# Patient Record
Sex: Female | Born: 2000 | Hispanic: Yes | Marital: Single | Smoking: Never smoker
Health system: Southern US, Community
[De-identification: ages and names within clinical notes are randomized; demographics above are authoritative.]

## PROBLEM LIST (undated history)

## (undated) ENCOUNTER — Inpatient Hospital Stay (HOSPITAL_COMMUNITY): Payer: Self-pay

## (undated) DIAGNOSIS — O99810 Abnormal glucose complicating pregnancy: Secondary | ICD-10-CM

## (undated) DIAGNOSIS — E119 Type 2 diabetes mellitus without complications: Secondary | ICD-10-CM

## (undated) DIAGNOSIS — O24419 Gestational diabetes mellitus in pregnancy, unspecified control: Secondary | ICD-10-CM

## (undated) DIAGNOSIS — B009 Herpesviral infection, unspecified: Secondary | ICD-10-CM

## (undated) HISTORY — DX: Abnormal glucose complicating pregnancy: O99.810

---
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
## 2019-08-05 ENCOUNTER — Other Ambulatory Visit: Payer: Self-pay

## 2019-08-05 ENCOUNTER — Encounter (HOSPITAL_COMMUNITY): Payer: Self-pay | Admitting: Emergency Medicine

## 2019-08-05 ENCOUNTER — Ambulatory Visit (HOSPITAL_COMMUNITY)
Admission: EM | Admit: 2019-08-05 | Discharge: 2019-08-05 | Disposition: A | Discharge status: Home / Self Care | Payer: Medicaid Other | Attending: Family Medicine | Admitting: Family Medicine

## 2019-08-05 DIAGNOSIS — H6982 Other specified disorders of Eustachian tube, left ear: Secondary | ICD-10-CM

## 2019-08-05 DIAGNOSIS — H6992 Unspecified Eustachian tube disorder, left ear: Secondary | ICD-10-CM

## 2019-08-05 MED ORDER — IBUPROFEN 600 MG PO TABS: 600.0000 mg | ORAL_TABLET | Freq: Three times a day (TID) | ORAL | 0 refills | Status: DC | PRN

## 2019-08-05 MED ORDER — FLUTICASONE PROPIONATE 50 MCG/ACT NA SUSP: 1.0000 | Freq: Every day | NASAL | 2 refills | Status: DC

## 2019-08-05 NOTE — ED Provider Notes (Signed)
[facility]    CSN: [id number] Arrival date & time: 08/05/19  1205      History   Chief Complaint Chief Complaint  Patient presents with  . Otalgia    HPI Danielle Lowery is a 19 y.o. female.   Patient is a 19 year old female presents today with left ear pain.  This started yesterday.  She is also has been associated nasal congestion and time to time.  Denies any current cough, runny nose, fever, sore throat.      History reviewed. No pertinent past medical history.  There are no problems to display for this patient.   History reviewed. No pertinent surgical history.  OB History   No obstetric history on file.      Home Medications    Prior to Admission medications   Medication Sig Start Date End Date Taking? Authorizing Provider  fluticasone (FLONASE) 50 MCG/ACT nasal spray Place 1 spray into both nostrils daily. 08/05/19   Dahlia Byes A, NP  ibuprofen (ADVIL) 600 MG tablet Take 1 tablet (600 mg total) by mouth every 8 (eight) hours as needed for moderate pain. 08/05/19   Dahlia Byes A, NP  famotidine (PEPCID) 20 MG tablet Take 1 tablet (20 mg total) by mouth 2 (two) times daily. 07/20/17 08/05/19  Liberty Handy, PA-C    Family History Family History  Problem Relation Age of Onset  . Diabetes Mother   . Diabetes Father     Social History Social History   Tobacco Use  . Smoking status: Never Smoker  . Smokeless tobacco: Never Used  Substance Use Topics  . Alcohol use: No  . Drug use: No     Allergies   Patient has no known allergies.   Review of Systems Review of Systems   Physical Exam Triage Vital Signs ED Triage Vitals  Enc Vitals Group     BP 08/05/19 1341 113/65     Pulse Rate 08/05/19 1341 70     Resp 08/05/19 1341 16     Temp 08/05/19 1341 98.9 F (37.2 C)     Temp Source 08/05/19 1341 Oral     SpO2 08/05/19 1341 100 %     Weight --      Height --      Head Circumference --      Peak Flow --      Pain Score  08/05/19 1346 4     Pain Loc --      Pain Edu? --      Excl. in GC? --    No data found.  Updated Vital Signs BP 113/65   Pulse 70   Temp 98.9 F (37.2 C) (Oral)   Resp 16   LMP 07/27/2019   SpO2 100%   Visual Acuity Right Eye Distance:   Left Eye Distance:   Bilateral Distance:    Right Eye Near:   Left Eye Near:    Bilateral Near:     Physical Exam Vitals and nursing note reviewed.  Constitutional:      General: She is not in acute distress.    Appearance: Normal appearance. She is not ill-appearing, toxic-appearing or diaphoretic.  HENT:     Head: Normocephalic.     Right Ear: Tympanic membrane and ear canal normal.     Left Ear: Tympanic membrane and ear canal normal.     Nose: Nose normal.  Eyes:     Conjunctiva/sclera: Conjunctivae normal.  Pulmonary:     Effort: Pulmonary  effort is normal.  Musculoskeletal:        General: Normal range of motion.     Cervical back: Normal range of motion.  Skin:    General: Skin is warm and dry.     Findings: No rash.  Neurological:     Mental Status: She is alert.  Psychiatric:        Mood and Affect: Mood normal.      UC Treatments / Results  Labs (all labs ordered are listed, but only abnormal results are displayed) Labs Reviewed - No data to display  EKG   Radiology No results found.  Procedures Procedures (including critical care time)  Medications Ordered in UC Medications - No data to display  Initial Impression / Assessment and Plan / UC Course  I have reviewed the triage vital signs and the nursing notes.  Pertinent labs & imaging results that were available during my care of the patient were reviewed by me and considered in my medical decision making (see chart for details).     Eustachian tube dysfunction No signs of ear infection on exam We will prescribe Flonase Ibuprofen as needed Follow up as needed for continued or worsening symptoms  Final Clinical Impressions(s) / UC Diagnoses    Final diagnoses:  Eustachian tube dysfunction, left     Discharge Instructions     The ear is not infected Use the Flonase daily and ibuprofen as needed for pain.  Follow up as needed for continued or worsening symptoms     ED Prescriptions    Medication Sig Dispense Auth. Provider   fluticasone (FLONASE) 50 MCG/ACT nasal spray Place 1 spray into both nostrils daily. 16 g Arrionna Serena A, NP   ibuprofen (ADVIL) 600 MG tablet Take 1 tablet (600 mg total) by mouth every 8 (eight) hours as needed for moderate pain. 30 tablet Dahlia Byes A, NP     PDMP not reviewed this encounter.   Dahlia Byes A, NP 08/05/19 1500

## 2019-08-05 NOTE — ED Triage Notes (Signed)
Left ear pain started yesterday.  Denies a cough or runny nose

## 2019-08-05 NOTE — Discharge Instructions (Addendum)
The ear is not infected Use the Flonase daily and ibuprofen as needed for pain.  Follow up as needed for continued or worsening symptoms

## 2021-05-15 ENCOUNTER — Ambulatory Visit (INDEPENDENT_AMBULATORY_CARE_PROVIDER_SITE_OTHER): Payer: Medicaid Other | Admitting: Obstetrics & Gynecology

## 2021-05-15 ENCOUNTER — Encounter: Payer: Self-pay | Admitting: General Practice

## 2021-05-15 VITALS — BP 118/66 | HR 83 | Ht 59.0 in | Wt 141.0 lb

## 2021-05-15 DIAGNOSIS — Z3201 Encounter for pregnancy test, result positive: Secondary | ICD-10-CM

## 2021-05-15 LAB — POCT PREGNANCY, URINE: Preg Test, Ur: POSITIVE — AB

## 2021-05-15 NOTE — Progress Notes (Signed)
Patient presents to office today for UPT. UPT +. Patient reports first positive home test beginning of March. She reports history of irregular periods and can go months without one. LMP 01/23/21 EDD 10/30/21 16 weeks today. Patient would like to go to [place] office for prenatal care. Recommended she begin taking PNV. ? ?Chase Caller RN BSN ?05/15/21 ? ?

## 2021-05-15 NOTE — Patient Instructions (Signed)
Center for [facility] [place]: [phone] ?

## 2021-05-15 NOTE — Progress Notes (Signed)
Patient ID: Danielle Lowery, female   DOB: Jan 03, 2001, 21 y.o.   MRN: [id number] ?Patient was assessed and managed by nursing staff during this encounter. I have reviewed the chart and agree with the documentation and plan. I have also made any necessary editorial changes. ? ?Scheryl Darter, MD ?05/15/2021 4:36 PM  ? ?

## 2021-06-07 ENCOUNTER — Ambulatory Visit (INDEPENDENT_AMBULATORY_CARE_PROVIDER_SITE_OTHER): Payer: Medicaid Other | Admitting: Emergency Medicine

## 2021-06-07 DIAGNOSIS — Z3402 Encounter for supervision of normal first pregnancy, second trimester: Secondary | ICD-10-CM | POA: Insufficient documentation

## 2021-06-07 MED ORDER — GOJJI WEIGHT SCALE MISC: 1.0000 | 0 refills | Status: DC

## 2021-06-07 MED ORDER — PRENATAL GUMMIES 0.18-25 MG PO CHEW: 1.0000 | CHEWABLE_TABLET | Freq: Every day | ORAL | 11 refills | Status: DC

## 2021-06-07 MED ORDER — BLOOD PRESSURE KIT DEVI: 1.0000 | 0 refills | Status: DC

## 2021-06-07 NOTE — Progress Notes (Signed)
New OB Intake  I connected with  Ashok Croon on 06/07/21 at  9:00 AM EDT by telephone Video Visit and verified that I am speaking with the correct person using two identifiers. Nurse is located at [facility] and pt is located at [place].  I discussed the limitations, risks, security and privacy concerns of performing an evaluation and management service by telephone and the availability of in person appointments. I also discussed with the patient that there may be a patient responsible charge related to this service. The patient expressed understanding and agreed to proceed.  I explained I am completing New OB Intake today. We discussed her EDD of 10/30/21 that is based on LMP of 01/23/21. Pt is G1P000. I reviewed her allergies, medications, Medical/Surgical/OB history, and appropriate screenings. I informed her of [facility] services. Based on history, this is a/an  pregnancy uncomplicated .   There are no problems to display for this patient.   Concerns addressed today  Delivery Plans:  Plans to deliver at [facility].   MyChart/Babyscripts MyChart access verified. I explained pt will have some visits in office and some virtually. Babyscripts instructions given and order placed. Patient verifies receipt of registration text/e-mail. Account successfully created and app downloaded.  Blood Pressure Cuff  Blood pressure cuff ordered for patient to pick-up from [employer]. Explained after first prenatal appt pt will check weekly and document in Babyscripts.  Weight scale: Patient does not  have weight scale. Weight scale ordered for patient to pick up from [employer].   Anatomy US Explained first scheduled [place] will be scheduled today.   Labs Discussed Avelina Laine genetic screening with patient. Would like both Panorama and Horizon drawn at new OB visit.Also if interested in genetic testing, tell patient she will need AFP 15-21 weeks to complete genetic testing .Routine prenatal labs  needed.  Covid Vaccine Patient has covid vaccine.    Social Determinants of Health Food Insecurity: Patient denies food insecurity. WIC Referral: Patient is interested in referral to [facility].  Transportation: Patient denies transportation needs. Childcare: Discussed no children allowed at ultrasound appointments. Offered childcare services; patient declines childcare services at this time.  Send link to Pregnancy Navigators   Placed OB Box on problem list and updated  First visit review I reviewed new OB appt with pt. I explained she will have a pelvic exam, ob bloodwork with genetic screening, and PAP smear. Explained pt will be seen by Clearance Coots, MD at first visit; encounter routed to appropriate provider. Explained that patient will be seen by pregnancy navigator following visit with provider. [facility] information placed in AVS.   Sharlyne Pacas, RN 06/07/2021  9:07 AM

## 2021-06-12 ENCOUNTER — Other Ambulatory Visit: Payer: Self-pay | Admitting: Emergency Medicine

## 2021-06-12 DIAGNOSIS — Z3402 Encounter for supervision of normal first pregnancy, second trimester: Secondary | ICD-10-CM

## 2021-06-14 ENCOUNTER — Other Ambulatory Visit (HOSPITAL_COMMUNITY)
Admission: RE | Admit: 2021-06-14 | Discharge: 2021-06-14 | Disposition: A | Discharge status: Home / Self Care | Payer: Medicaid Other | Source: Ambulatory Visit | Attending: Obstetrics | Admitting: Obstetrics

## 2021-06-14 ENCOUNTER — Ambulatory Visit (INDEPENDENT_AMBULATORY_CARE_PROVIDER_SITE_OTHER): Payer: Medicaid Other | Admitting: Medical

## 2021-06-14 ENCOUNTER — Encounter: Payer: Self-pay | Admitting: Medical

## 2021-06-14 VITALS — BP 121/70 | HR 88 | Wt 149.8 lb

## 2021-06-14 DIAGNOSIS — Z3A20 20 weeks gestation of pregnancy: Secondary | ICD-10-CM

## 2021-06-14 DIAGNOSIS — O0930 Supervision of pregnancy with insufficient antenatal care, unspecified trimester: Secondary | ICD-10-CM | POA: Insufficient documentation

## 2021-06-14 DIAGNOSIS — Z3402 Encounter for supervision of normal first pregnancy, second trimester: Secondary | ICD-10-CM | POA: Diagnosis present

## 2021-06-14 DIAGNOSIS — B9689 Other specified bacterial agents as the cause of diseases classified elsewhere: Secondary | ICD-10-CM

## 2021-06-14 DIAGNOSIS — O26892 Other specified pregnancy related conditions, second trimester: Secondary | ICD-10-CM

## 2021-06-14 DIAGNOSIS — N760 Acute vaginitis: Secondary | ICD-10-CM

## 2021-06-14 DIAGNOSIS — N898 Other specified noninflammatory disorders of vagina: Secondary | ICD-10-CM

## 2021-06-14 NOTE — Progress Notes (Signed)
NOB pt presents for initial OB visit. Pt interested in water birth. No concerns at this time.

## 2021-06-14 NOTE — Progress Notes (Signed)
   PRENATAL VISIT NOTE  Subjective:  Danielle Lowery is a 21 y.o. G1P0000 at [redacted]w[redacted]d being seen today for her first prenatal visit for this pregnancy.  She is currently monitored for the following issues for this low-risk pregnancy and has Encounter for supervision of normal first pregnancy in second trimester and Late prenatal care on their problem list.  Patient reports vaginal discharge.  Contractions: Not present. Vag. Bleeding: None.  Movement: Absent. Denies leaking of fluid.   She is planning to both breast and formula feed. Unsure about contraception.   The following portions of the patient's history were reviewed and updated as appropriate: allergies, current medications, past family history, past medical history, past social history, past surgical history and problem list.   Objective:   Vitals:   06/14/21 1103  BP: 121/70  Pulse: 88  Weight: 149 lb 12.8 oz (67.9 kg)    Fetal Status: Fetal Heart Rate (bpm): 148      Movement: Absent     General:  Alert, oriented and cooperative. Patient is in no acute distress.  Skin: Skin is warm and dry. No rash noted.   Cardiovascular: Normal heart rate and rhythm noted  Respiratory: Normal respiratory effort, no problems with respiration noted. Clear to auscultation.   Abdomen: Soft, gravid, appropriate for gestational age. Normal bowel sounds. Non-tender. Pain/Pressure: Present     Pelvic: Cervical exam performed Dilation: Closed Effacement (%): Thick   Normal cervical contour, no lesions, no bleeding following pap, normal discharge  Extremities: Normal range of motion.  Edema: None  Mental Status: Normal mood and affect. Normal behavior. Normal judgment and thought content.   Assessment and Plan:  Pregnancy: G1P0000 at [redacted]w[redacted]d 1. Encounter for supervision of normal first pregnancy in second trimester - Genetic Screening - Culture, OB Urine - Cytology - PAP( [place]) - Cervicovaginal ancillary only( [place]) -  CBC/D/Plt+RPR+Rh+ABO+RubIgG...  2. Late prenatal care - First visit at 20 weeks   3. Vaginal discharge during pregnancy in second trimester - Aptima swab today   4. [redacted] weeks gestation of pregnancy  Preterm labor/first trimester warning symptoms and general obstetric precautions including but not limited to vaginal bleeding, contractions, leaking of fluid and fetal movement were reviewed in detail with the patient. Please refer to After Visit Summary for other counseling recommendations.   Discussed the normal visit cadence for prenatal care Discussed the nature of our practice with multiple providers including residents and students   Return in about 4 weeks (around 07/12/2021) for LOB, In-Person, any provider.  Future Appointments  Date Time Provider Department Center  07/05/2021  2:15 PM [facility] NURSE [facility]  07/05/2021  2:30 PM [facility]  07/13/2021 11:15 AM Gerrit Heck, CNM [facility] None    Vonzella Nipple, PA-C

## 2021-06-14 NOTE — Patient Instructions (Addendum)
Safe Medications in Pregnancy   Acne:  Benzoyl Peroxide  Salicylic Acid   Backache/Headache:  Tylenol: 2 regular strength every 4 hours OR               2 Extra strength every 6 hours   Colds/Coughs/Allergies:  Benadryl (alcohol free) 25 mg every 6 hours as needed  Breath right strips  Claritin  Cepacol throat lozenges  Chloraseptic throat spray  Cold-Eeze- up to three times per day  Cough drops, alcohol free  Flonase (by prescription only)  Guaifenesin  Mucinex  Robitussin DM (plain only, alcohol free)  Saline nasal spray/drops  Sudafed (pseudoephedrine) & Actifed * use only after [redacted] weeks gestation and if you do not have high blood pressure  Tylenol  Vicks Vaporub  Zinc lozenges  Zyrtec   Constipation:  Colace  Ducolax suppositories  Fleet enema  Glycerin suppositories  Metamucil  Milk of magnesia  Miralax  Senokot  Smooth move tea   Diarrhea:  Kaopectate  Imodium A-D   *NO pepto Bismol   Hemorrhoids:  Anusol  Anusol HC  Preparation H  Tucks   Indigestion:  Tums  Maalox  Mylanta  Zantac  Pepcid   Insomnia:  Benadryl (alcohol free) 25mg every 6 hours as needed  Tylenol PM  Unisom, no Gelcaps   Leg Cramps:  Tums  MagGel   Nausea/Vomiting:  Bonine  Dramamine  Emetrol  Ginger extract  Sea bands  Meclizine  Nausea medication to take during pregnancy:  Unisom (doxylamine succinate 25 mg tablets) Take one tablet daily at bedtime. If symptoms are not adequately controlled, the dose can be increased to a maximum recommended dose of two tablets daily (1/2 tablet in the morning, 1/2 tablet mid-afternoon and one at bedtime).  Vitamin B6 100mg tablets. Take one tablet twice a day (up to 200 mg per day).   Skin Rashes:  Aveeno products  Benadryl cream or 25mg every 6 hours as needed  Calamine Lotion  1% cortisone cream   Yeast infection:  Gyne-lotrimin 7  Monistat 7    **If taking multiple medications, please check labels to avoid  duplicating the same active ingredients  **take medication as directed on the label  ** Do not exceed 4000 mg of tylenol in 24 hours  **Do not take medications that contain aspirin or ibuprofen          AREA PEDIATRIC/FAMILY PRACTICE PHYSICIANS  [place] ([address]) [facility] Chambliss, MD; Eniola, MD; Hale, MD; Hensel, MD; McDiarmid, MD; McIntyer, MD; Neal, MD; Walden, MD [address] [place], [place] [address] [phone] Mon-Fri 8:30-12:30, 1:30-5:00 Providers come to see babies at [facility] Accepting Medicaid [facility] providers who accept newborns: Koirala, MD; Morrow, MD; Wolters, MD [address], [place], [place] [address] [phone] Mon-Fri 8:00-5:30 Babies seen by providers at [facility] Does NOT accept Medicaid Please call early in hospitalization for appointment (limited availability)  [facility] Mulberry, MD [address] [place], [place] [address] [phone] Mon, Tue, Thur, Fri 8:30-5:00, Wed 10:00-7:00 (closed 1-2pm) Babies seen by [facility] providers Accepting Medicaid Rubin - Pediatrician Rubin, MD [address], ,  [address] [phone] Mon-Fri 8:30-5:00, Sat 8:30-12:00 Provider comes to see babies at [facility] Accepting Medicaid Must have been referred from current patients or contacted office prior to delivery [facility] ([facility]) Brown, MD; Chandler, MD; Ettefagh,   MD; Grant, MD; Lester, MD; McCormick, MD; McQueen, MD; Prose, MD; Simha, MD; Stanley, MD; Stryffeler, NP; Tebben, NP [address], [place], [place] [address] [phone] Mon, Tue, Thur, Fri 8:30-5:30, Wed 9:30-5:30, Sat 8:30-12:30 Babies seen by [facility] providers Accepting Medicaid Only accepting infants of first-time parents or  siblings of current patients Hospital discharge coordinator will make follow-up appointment Jack Amos [address], [place], [place]  [address] [phone]   Fax - [phone] [facility] [address], [place], [place]  [address] Phone - [phone]   Fax - [phone] Shilpa Gosrani [address], [place], [place]  [address] [phone]  [place] ([address]) [facility] Bates, MD; Brassfield, MD; Cooper, Cox, MD; MD; Davis, MD; Dovico, MD; Ettefaugh, MD; Little, MD; Lowe, MD; Keiffer, MD; Melvin, MD; Sumner, MD; Williams, MD [address], [place], [place] [address] [phone] Mon-Fri 8:30-5:00 (extended evenings Mon-Thur as needed), Sat-Sun 10:00-1:00 Providers come to see babies at [facility] Accepting Medicaid for families of first-time babies and families with all children in the household age 3 and under. Must register with office prior to making appointment (M-F only). [facility] Henson, NP; Knapp, MD; Lalonde, MD; Tysinger, PA [address] [place], [place] [address] [phone] Mon-Fri 8:00-5:00 Babies seen by providers at [facility] Does NOT accept Medicaid/Commercial Insurance Only [facility] ([facility])  Artis, MD; Barnes, MD; Bratton, MD; Coccaro, MD; [place] Gardner, MD; Kramer, MD; Marshall, MD; Netherton, MD; Poleto, MD; Skinner, MD [address] [place], [place] [address] [phone] Mon-Fri 8:30-5:30, Sat (Oct.-Mar.) 9:00-1:00 Babies seen by providers at [facility] Accepting Medicaid  [place] ([address]) [facility] Reid, MD; Warner, MD [address], [place], [place] [address] [phone] Mon-Fri 8:30-5:00, Sat 8:30-12:00 Providers come to see babies at [facility] Does NOT accept Medicaid [facility] Becker, PA; Hagler, MD; Scifres, PA; Sun,  MD; Swayne, MD [address], [place], [place] [address] [phone] Mon-Fri 8:00-5:00 Babies seen by providers at [facility] Does NOT accept Medicaid Only accepting babies of parents who are patients Please call early in hospitalization for appointment (limited availability) [facility] Clark, MD; Frye, MD; Kelleher, MD; Mack, NP; Miller, MD; O'Keller, MD; Patterson, NP; Pudlo, MD; Puzio, MD; Thomas, MD; Tucker, MD; Twiselton, MD [address], [place], [place] [address] [phone] Mon-Fri 8:00-5:00, Sat 9:00-12:00 Providers come to see babies at [facility] Does NOT accept Medicaid  [place] ([address]) [facility] providers accepting new patients: Brake, NP; Wharton, PA [address], [place], [place] [address] [phone] Mon-Fri 8:00-5:00 Babies seen by providers at [facility] Does NOT accept Medicaid Only accepting babies of parents who are patients Please call early in hospitalization for appointment (limited availability) [facility] Gay, MD; Quinlan, MD [address] [place], [place] [address] [phone] (press 1 to schedule appointment) Mon-Fri 8:00-5:00 Providers come to see babies at [facility] Does NOT accept Medicaid [facility] Mazer, MD [address] [place], [place] [address] [phone] Mon-Fri 8:30-5:00 (lunch 12:30-1:00), extended hours by appointment only Wed 5:00-6:30 Babies seen by [facility] providers Accepting Medicaid [facility] Banks, MD; Jordan, MD; Koberlein, MD [address], ,  [address] [phone] Mon-Fri 8:00-5:00 Babies seen by [facility] providers Does NOT accept Medicaid [facility] Parker, MD; Hunter, MD; Wallace, DO [address]   [place], [place] [address] [phone] Mon-Fri 8:00-5:00 Babies seen by [facility] providers Does NOT accept Medicaid [facility] Brandon, PA; Brecken, PA; Christy, NP; Dees, MD; DeClaire, MD; DeWeese, MD; Hansen, NP; Mills, NP; Parrish, NP; Smoot, NP; Summer, MD; Vapne, MD [address] [place], [place] [address] [phone] Mon-Fri 8:30-5:00, Sat 10:00-1:00 Providers come to see babies at [facility] Does NOT accept Medicaid Free prenatal information session Tuesdays at 4:45pm [facility] Bouska, MD; Gordon, PA; Jeffery, PA; Weber, PA [address] [place] [address] [phone] Mon-Fri 7:30-5:30 Babies seen by [facility] providers [facility] [address], [place], [place]  [address] [phone]   Fax - [phone]  [place] ([address] & [address]) [facility] [place] [address] [place], [place] [address] [phone] Mon-Thur 8:00-6:00 Providers come to see babies at [facility] Accepting Medicaid [facility] Anderson, NP; Badger, MD; Beal, PA; Spencer, PA [address] [place], [place] [address] [phone] Mon-Thur 7:30-7:30, Fri 7:30-4:30 Babies seen by [facility] providers Accepting Medicaid [facility] Agbuya, MD; Klett, NP; Romgoolam, MD [address], [place], [place] [address] [phone] Mon-Fri 8:30-5:00, Sat 8:30-12:00 Providers come to see babies at [facility] Accepting Medicaid Must have "Meet & Greet" appointment at office prior to delivery [facility] ([facility] McCord, MD; Wallace, MD; Wood, MD [address], [place], [place] [address] [phone] Mon-Wed 8:00-6:00, Thur-Fri 8:00-5:00, Sat 9:00-12:00 Providers come to see babies at [facility] Does NOT accept Medicaid Only accepting siblings of current patients [facility]  [address], [place], [place]  [address] [phone]   Fax - [phone] [facility] [address], [place], [place]  [address] [phone]   Fax - [phone]  [place] ([address] & [address]) [facility] Cirigliano, DO; Matthews, DO [address] [place], [place] [address] [phone] Mon-Fri 7:00-5:00 Babies seen by [facility] providers Does NOT accept Medicaid [facility] Briscoe, MD; Howley, PA; Moreira, PA [address], [place], [place] [address] [phone] Mon-Fri 8:00-5:00 Babies seen by [facility] providers Accepting Medicaid [facility] Boyd, MD; Church, PA; Jones, NP; Osborn, PA [address], , [place] [address] [phone] Mon-Fri 8:00-5:00 Babies seen by providers at [facility] Accepting Medicaid  [place] ([address]) [facility] [place] Wendling, DO [address] [place], [place] [address] [phone] Mon-Fri 8:00-5:00 Babies seen by [facility] providers Does NOT accept Medicaid Limited availability, please call early in hospitalization to schedule follow-up [facility] Calderon, PA; Cummings, MD; Dillard, MD; Martin, PA; Olson, MD; VanDeven, PA [address], [place], [place] [address] [phone] Mon-Fri 8:30-5:00, Sat 9:00-12:00 Babies seen by providers at [facility] Accepting Medicaid Please register online then schedule online or call office [email or web address] Hunter, NP; Kumar, MD; Martin Rogers, PA [address], [place], [place] [address] [phone] Mon-Fri 8:00-5:00 Babies seen by providers at [facility] Accepting Medicaid [facility] ([facility] [place] Lealman, MD; Kristi Fleenor, NP; West, MD [address], [place],  [address] [phone] Mon-Fri 8:00-5:30, Sat&Sun by appointment (phones open at 8:30) Babies seen by [facility] providers Accepting Medicaid Must be   a first-time baby or sibling of current patient [facility] [place]  [address], [place], [place]  [address] [phone]   Fax - [phone]  [place] ([address]) [facility] Brown, PA; Cowen, PA; Rice, MD; Helton, PA; Spry, MD [address] [place], [place] [address] [phone] Mon-Thur 8:00-7:00, Fri 8:00-5:00, Sat 8:00-12:00, Sun 9:00-12:00 Babies seen by [facility] providers Accepting Medicaid [facility] [place] Coe-Goins, MD; Marshall, MD; Pierre-Louis, MD [address], [place], [place] [address] [phone] Mon-Thur 8:00-5:00 Babies seen by providers at [facility] Accepting Medicaid [facility] Bratton, MD; Coe-Goins, MD; Hayes, MD; Lewis, MD; List, MD; Lott, MD; Marshall, MD; Moran, MD; O'Neal, MD; Pierre-Louis, MD; Pitonzo, MD; Scholer, MD; Spangle, MD [address] [place], [place] [address] [phone] Mon-Fri 8:00-5:30, Sat (Oct.-Mar.) 9:00-1:00 Babies seen by providers at [facility] Accepting Medicaid Must fill out new patient packet, available online at [email or web address] [facility] ([facility]) Friddle, NP; Harris, NP; Kelly, NP; Logan, MD; Melvin, PA; Poth, MD; Ramadoss, MD; Stanton, NP [address], [place], [place] [address] [phone] Mon-Thur 8:00-5:30, Fri 8:00-5:00 Babies seen by providers at [facility] Accepting Medicaid  [place] ([address]) [facility] Dixon, PA; Pantops, MD; Pickard, MD; Tapia, PA [address],  [place], [place] [address] [phone] Mon-Fri 8:00-5:00 Babies seen by providers at [facility] Accepting Medicaid   [place] ([address]) [facility] [place] Masneri, DO; Meyers, MD; Nelson, PA [address], [place], [place] [address] [phone] Mon-Fri 8:00-5:00 Babies seen by providers at [facility] Does NOT accept Medicaid Limited appointment availability, please call early in hospitalization  [facility] [place] Kunedd, DO; McGowen, MD [address], [place], [place] [address] [phone] Mon-Fri 8:00-5:00 Babies seen by [facility] providers Does NOT accept Medicaid [facility] [place] Cameron, MD; MacDonald, MD; Michaels, PA; Nayak, MD [address] [place] [address], [place], [place] [address] [phone] Mon-Fri 8:00-5:00 After hours clinic ([address] [place], [place] [address]) [phone] Mon-Fri 5:00-8:00, Sat 12:00-6:00, Sun 10:00-4:00 Babies seen by [facility] providers Accepting Medicaid [facility] [place] [address], [place], [place]  [address] [phone]   Fax - [phone]  [place] ([address]) [facility] [place] [facility] Andy, MD [address], [place], [place] [address] [phone] Mon-Fri 8:00-5:00 Babies seen by [facility] providers Does NOT accept Medicaid [facility] [place] ([facility] [place]) Eksir, MD [address], [place], [place] [address] [phone] Mon-Thur 8:00-7:00, Fri 8:00-5:00, Sat 8:00-12:00 Babies seen by providers at [facility] Accepting Medicaid - but does not have vaccinations in office (must be received elsewhere) Limited availability, please call early in hospitalization  [place] ([address]) [facility]  Charlene Flemming, MD [address], [place] [address] [phone]  Fax [phone]  [facility]  Kimberly Newton, MD, Annamarie Streilein, PA, Carla Hampton, PA [address] [place], [place] [address] [phone] [facility]  [address], [place], [place] [address] [phone] [address], [place], [place] [address] [phone] (West Office)  [facility] [address], [place], [place] [address] [phone] [facility] [address], [place],   [place] [address] [phone] [facility] [address], [place], [place] [address] [phone] [facility] [address] [place], [place] [address] [phone] [facility] [address] [place], [place] [address] [phone] [facility] [address] [place], [place] [address] [phone] [facility]  [address] [place], [place] [address] [phone] Dr. Belia Heman. [place] [address] [place], [place] [address] [phone] [facility] [address], [place], [place] [address] [phone] [facility] [address] [place], [place] [address] [phone]   Considering [place] Dolin? Guide for patients at [facility] [employer] [facility]) Why consider waterbirth? Gentle birth for babies  Less pain medicine used in labor  May allow for passive descent/less pushing  May reduce perineal tears  More mobility and instinctive maternal position changes  Increased maternal relaxation   Is waterbirth safe? What are the risks of infection, drowning or other complications? Infection:  Very low risk (3.7 % for tub vs 4.8% for bed)  7 in 8000 waterbirths with documented infection  Poorly cleaned equipment most common cause  Slightly lower group B strep transmission rate  Drowning  Maternal:  Very low risk  Related to seizures or fainting  Newborn:  Very low risk. No evidence of increased risk of  respiratory problems in multiple large studies  Physiological protection from breathing under water  Avoid underwater birth if there are any fetal complications  Once baby's head is out of the water, keep it out.  Birth complication  Some reports of cord trauma, but risk decreased by bringing baby to surface gradually  No evidence of increased risk of shoulder dystocia. Mothers can usually change positions faster in water than in a bed, possibly aiding the maneuvers to free the shoulder.   There are 2 things you MUST do to have a waterbirth with [facility]: Attend a waterbirth class at [employer] [facility]   3rd Wednesday of every month from 7-9 pm (virtual during COVID) [employer] at [email or web address] or [email or web address] or by calling [phone] Bring [place] the certificate from the class to your prenatal appointment or send via MyChart Meet with a midwife at 36 weeks* to see if you can still plan a waterbirth and to sign the consent.   *We also recommend that you schedule as many of your prenatal visits with a midwife as possible.    Helpful information: You may want to bring a bathing suit top to the hospital to wear during labor but this is optional.  All other supplies are provided by the hospital. Please arrive at the hospital with signs of active labor, and do not wait at home until late in labor. It takes 45 min- 1 hour for fetal monitoring, and check in to your room to take place, plus transport and filling of the waterbirth tub.    Things that would prevent you from having a waterbirth: Premature, <37wks  Previous cesarean birth  Presence of thick meconium-stained fluid  Multiple gestation (Twins, triplets, etc.)  Uncontrolled diabetes or gestational diabetes requiring medication  Hypertension diagnosed in pregnancy or preexisting hypertension (gestational hypertension, preeclampsia, or chronic hypertension) Fetal growth restriction (your  baby measures less than 10th percentile on ultrasound) Heavy vaginal bleeding  Non-reassuring fetal heart rate  Active infection (MRSA, etc.). Group B Strep is NOT a contraindication for waterbirth.  If your labor has to be induced and induction method requires continuous monitoring of the baby's heart rate  Other risks/issues  identified by your obstetrical provider   Please remember that birth is unpredictable. Under certain unforeseeable circumstances your provider may advise against giving birth in the tub. These decisions will be made on a case-by-case basis and with the safety of you and your baby as our highest priority.    Updated 04/12/21  Childbirth Education Options: [facility] Department Classes:  Childbirth education classes can help you get ready for a positive parenting experience. You can also meet other expectant parents and get free stuff for your baby. Each class runs for five weeks on the same night and costs $45 for the mother-to-be and her support person. Medicaid covers the cost if you are eligible. Call [phone] to register. [facility] Childbirth Education: Classes can vary in availability and schedule is subject to change. For most up-to-date information please visit [email or web address] to review and register.

## 2021-06-14 NOTE — Addendum Note (Signed)
Addended by: Jearld Adjutant on: 06/14/2021 11:46 AM   Modules accepted: Orders

## 2021-06-15 LAB — CERVICOVAGINAL ANCILLARY ONLY
Bacterial Vaginitis (gardnerella): POSITIVE — AB
Candida Glabrata: NEGATIVE
Candida Vaginitis: NEGATIVE
Chlamydia: NEGATIVE
Comment: NEGATIVE
Comment: NEGATIVE
Comment: NEGATIVE
Comment: NEGATIVE
Comment: NEGATIVE
Comment: NORMAL
Neisseria Gonorrhea: NEGATIVE
Trichomonas: NEGATIVE

## 2021-06-15 LAB — CYTOLOGY - PAP: Diagnosis: NEGATIVE

## 2021-06-16 LAB — CBC/D/PLT+RPR+RH+ABO+RUBIGG...
Antibody Screen: NEGATIVE
Basophils Absolute: 0 10*3/uL (ref 0.0–0.2)
Basos: 0 %
EOS (ABSOLUTE): 0.2 10*3/uL (ref 0.0–0.4)
Eos: 1 %
HCV Ab: NONREACTIVE
HIV Screen 4th Generation wRfx: NONREACTIVE
Hematocrit: 34.4 % (ref 34.0–46.6)
Hemoglobin: 11.7 g/dL (ref 11.1–15.9)
Hepatitis B Surface Ag: NEGATIVE
Immature Grans (Abs): 0.1 10*3/uL (ref 0.0–0.1)
Immature Granulocytes: 1 %
Lymphocytes Absolute: 2.2 10*3/uL (ref 0.7–3.1)
Lymphs: 20 %
MCH: 31 pg (ref 26.6–33.0)
MCHC: 34 g/dL (ref 31.5–35.7)
MCV: 91 fL (ref 79–97)
Monocytes Absolute: 0.8 10*3/uL (ref 0.1–0.9)
Monocytes: 7 %
Neutrophils Absolute: 7.9 10*3/uL — ABNORMAL HIGH (ref 1.4–7.0)
Neutrophils: 71 %
Platelets: 269 10*3/uL (ref 150–450)
RBC: 3.78 x10E6/uL (ref 3.77–5.28)
RDW: 13.3 % (ref 11.7–15.4)
RPR Ser Ql: NONREACTIVE
Rh Factor: POSITIVE
Rubella Antibodies, IGG: 7.49 index (ref >0.99)
WBC: 11.2 10*3/uL — ABNORMAL HIGH (ref 3.4–10.8)

## 2021-06-16 LAB — HEPATITIS C ANTIBODY: Hep C Virus Ab: NONREACTIVE

## 2021-06-16 LAB — CULTURE, OB URINE

## 2021-06-16 LAB — AFP, SERUM, OPEN SPINA BIFIDA
AFP MoM: 0.42
AFP Value: 24.5 ng/mL
Gest. Age on Collection Date: 20 weeks
Maternal Age At EDD: 23 yr
OSBR Risk 1 IN: 10000
Test Results:: NEGATIVE
Weight: 149 [lb_av]

## 2021-06-16 LAB — HCV INTERPRETATION

## 2021-06-16 LAB — URINE CULTURE, OB REFLEX

## 2021-06-16 MED ORDER — METRONIDAZOLE 500 MG PO TABS: 500.0000 mg | ORAL_TABLET | Freq: Two times a day (BID) | ORAL | 0 refills | Status: DC

## 2021-06-16 NOTE — Addendum Note (Signed)
Addended by: Luvenia Redden on: 06/16/2021 10:56 AM   Modules accepted: Orders

## 2021-06-22 ENCOUNTER — Encounter: Payer: Self-pay | Admitting: Obstetrics and Gynecology

## 2021-07-01 ENCOUNTER — Other Ambulatory Visit: Payer: Self-pay | Admitting: Advanced Practice Midwife

## 2021-07-01 ENCOUNTER — Inpatient Hospital Stay (HOSPITAL_COMMUNITY)
Admission: AD | Admit: 2021-07-01 | Discharge: 2021-07-01 | Disposition: A | Discharge status: Home / Self Care | Payer: Medicaid Other | Attending: Obstetrics and Gynecology | Admitting: Obstetrics and Gynecology

## 2021-07-01 ENCOUNTER — Other Ambulatory Visit: Payer: Self-pay

## 2021-07-01 DIAGNOSIS — O0930 Supervision of pregnancy with insufficient antenatal care, unspecified trimester: Secondary | ICD-10-CM

## 2021-07-01 DIAGNOSIS — O26892 Other specified pregnancy related conditions, second trimester: Secondary | ICD-10-CM | POA: Diagnosis not present

## 2021-07-01 DIAGNOSIS — O0932 Supervision of pregnancy with insufficient antenatal care, second trimester: Secondary | ICD-10-CM | POA: Diagnosis present

## 2021-07-01 DIAGNOSIS — Z3A22 22 weeks gestation of pregnancy: Secondary | ICD-10-CM | POA: Diagnosis not present

## 2021-07-01 DIAGNOSIS — N898 Other specified noninflammatory disorders of vagina: Secondary | ICD-10-CM

## 2021-07-01 LAB — URINALYSIS, ROUTINE W REFLEX MICROSCOPIC
Bilirubin Urine: NEGATIVE
Glucose, UA: NEGATIVE mg/dL
Hgb urine dipstick: NEGATIVE
Ketones, ur: NEGATIVE mg/dL
Nitrite: NEGATIVE
Protein, ur: NEGATIVE mg/dL
Specific Gravity, Urine: 1.01 (ref 1.005–1.030)
pH: 7 (ref 5.0–8.0)

## 2021-07-01 LAB — WET PREP, GENITAL
Clue Cells Wet Prep HPF POC: NONE SEEN
Sperm: NONE SEEN
Trich, Wet Prep: NONE SEEN
WBC, Wet Prep HPF POC: <10 (ref <10)
Yeast Wet Prep HPF POC: NONE SEEN

## 2021-07-01 MED ORDER — TERCONAZOLE 0.4 % VA CREA: 1.0000 | TOPICAL_CREAM | Freq: Every day | VAGINAL | 0 refills | Status: AC

## 2021-07-01 NOTE — MAU Note (Signed)
.  Danielle Lowery is a 21 y.o. at [redacted]w[redacted]d here in MAU reporting: awhite vaginal discharge x 3 days and itching started yesterday. Deneis any abl pain or cramping. Good fetal movement reported. No vag bleeding.  LMP:  Onset of complaint: 3 days Pain score: 0 Vitals:   07/01/21 1306  BP: 119/71  Pulse: (!) 106  Resp: 18  Temp: 98.3 F (36.8 C)     FHT:136 Lab orders placed from triage:  U/A , Wet prep, GC chlamydia

## 2021-07-01 NOTE — MAU Provider Note (Signed)
History     CSN: [id number]  Arrival date and time: 07/01/21 1253   Event Date/Time   First Provider Initiated Contact with Patient 07/01/21 1415      Chief Complaint  Patient presents with   Vaginal Itching   Danielle Lowery is a 21 y.o. G1P0000 at 14w5dwho presents today with white vaginal discharge x 3 days. She denies any contractions, abdominal pain, VB or LOF.   Vaginal Itching The patient's primary symptoms include genital itching and vaginal discharge. This is a new problem. The current episode started in the past 7 days. The problem occurs constantly. The problem has been unchanged. The patient is experiencing no pain. She is pregnant. The vaginal discharge was white. There has been no bleeding. Nothing aggravates the symptoms. She has tried nothing for the symptoms.    OB History     Gravida  1   Para  0   Term  0   Preterm  0   AB  0   Living  0      SAB  0   IAB  0   Ectopic  0   Multiple  0   Live Births  0           No past medical history on file.  Past Surgical History:  Procedure Laterality Date   DENTAL SURGERY  2015    Family History  Problem Relation Age of Onset   Diabetes Mother    Diabetes Father    Diabetes Maternal Grandmother    Diabetes Maternal Grandfather    Diabetes Paternal Grandmother    Cancer Paternal Grandfather     Social History   Tobacco Use   Smoking status: Never   Smokeless tobacco: Never  Vaping Use   Vaping Use: Never used  Substance Use Topics   Alcohol use: No   Drug use: No    Allergies: No Known Allergies  Medications Prior to Admission  Medication Sig Dispense Refill Last Dose   Blood Pressure Monitoring (BLOOD PRESSURE KIT) DEVI 1 Device by Does not apply route once a week. 1 each 0    metroNIDAZOLE (FLAGYL) 500 MG tablet Take 1 tablet (500 mg total) by mouth 2 (two) times daily. 14 tablet 0    Misc. Devices (GOJJI WEIGHT SCALE) MISC 1 Device by Does not apply route once a week.  1 each 0    Prenatal MV & Min w/FA-DHA (PRENATAL GUMMIES) 0.18-25 MG CHEW Chew 1 tablet by mouth daily. 30 tablet 11     Review of Systems  Genitourinary:  Positive for vaginal discharge.  All other systems reviewed and are negative.  Physical Exam   Blood pressure 119/71, pulse (!) 106, temperature 98.3 F (36.8 C), resp. rate 18, height _0  (1.499 m), weight 69.4 kg, last menstrual period 01/23/2021.  Physical Exam Constitutional:      Appearance: She is well-developed.  HENT:     Head: Normocephalic.  Eyes:     Pupils: Pupils are equal, round, and reactive to light.  Cardiovascular:     Rate and Rhythm: Normal rate and regular rhythm.     Heart sounds: Normal heart sounds.  Pulmonary:     Effort: Pulmonary effort is normal. No respiratory distress.     Breath sounds: Normal breath sounds.  Abdominal:     Palpations: Abdomen is soft.     Tenderness: There is no abdominal tenderness.  Genitourinary:    Vagina: No bleeding. Vaginal discharge: mucusy.  Comments: External: no lesion Vagina: small amount of white discharge     Musculoskeletal:        General: Normal range of motion.     Cervical back: Normal range of motion and neck supple.  Skin:    General: Skin is warm and dry.  Neurological:     Mental Status: She is alert and oriented to person, place, and time.  Psychiatric:        Mood and Affect: Mood normal.        Behavior: Behavior normal.   FHT 136 with doppler    Results for orders placed or performed during the hospital encounter of 07/01/21 (from the past 24 hour(s))  Wet prep, genital     Status: None   Collection Time: 07/01/21  1:18 PM   Specimen: PATH Cytology Cervicovaginal Ancillary Only  Result Value Ref Range   Yeast Wet Prep HPF POC NONE SEEN NONE SEEN   Trich, Wet Prep NONE SEEN NONE SEEN   Clue Cells Wet Prep HPF POC NONE SEEN NONE SEEN   WBC, Wet Prep HPF POC <10 <10   Sperm NONE SEEN   Urinalysis, Routine w reflex microscopic  Urine, Clean Catch     Status: Abnormal   Collection Time: 07/01/21  1:18 PM  Result Value Ref Range   Color, Urine YELLOW YELLOW   APPearance HAZY (A) CLEAR   Specific Gravity, Urine 1.010 1.005 - 1.030   pH 7.0 5.0 - 8.0   Glucose, UA NEGATIVE NEGATIVE mg/dL   Hgb urine dipstick NEGATIVE NEGATIVE   Bilirubin Urine NEGATIVE NEGATIVE   Ketones, ur NEGATIVE NEGATIVE mg/dL   Protein, ur NEGATIVE NEGATIVE mg/dL   Nitrite NEGATIVE NEGATIVE   Leukocytes,Ua SMALL (A) NEGATIVE   RBC / HPF 0-5 0 - 5 RBC/hpf   WBC, UA 0-5 0 - 5 WBC/hpf   Bacteria, UA RARE (A) NONE SEEN   Squamous Epithelial / LPF 11-20 0 - 5   Mucus PRESENT     MAU Course  Procedures  MDM DW patient that no yeast on wet prep today and it could be just increased normal discharge, but that if it is really irritating we could try yeast infection treatment to see if that helps. Sometimes yeast doesn't show up easily on the wet prep.   Assessment and Plan   1. Late prenatal care   2. Vaginal discharge during pregnancy in second trimester   3. [redacted] weeks gestation of pregnancy    DC home in stable condition  2nd Trimester precautions  PTL precautions  Fetal kick counts RX: Terazol 7 as directed  Return to MAU as needed FU with OB as planned   Follow-up Information     [facility].   Contact information: [address] [place] [id number] [place] DNP, CNM  07/01/21  2:20 PM

## 2021-07-02 LAB — CULTURE, OB URINE: Culture: <10000 — AB

## 2021-07-03 LAB — GC/CHLAMYDIA PROBE AMP (~~LOC~~) NOT AT ARMC
Chlamydia: NEGATIVE
Comment: NEGATIVE
Comment: NORMAL
Neisseria Gonorrhea: NEGATIVE

## 2021-07-05 ENCOUNTER — Ambulatory Visit: Payer: Medicaid Other | Attending: Obstetrics and Gynecology

## 2021-07-05 ENCOUNTER — Other Ambulatory Visit: Payer: Self-pay | Admitting: Obstetrics and Gynecology

## 2021-07-05 ENCOUNTER — Ambulatory Visit: Payer: Medicaid Other

## 2021-07-05 DIAGNOSIS — O0932 Supervision of pregnancy with insufficient antenatal care, second trimester: Secondary | ICD-10-CM | POA: Diagnosis not present

## 2021-07-05 DIAGNOSIS — Z3402 Encounter for supervision of normal first pregnancy, second trimester: Secondary | ICD-10-CM | POA: Diagnosis not present

## 2021-07-05 DIAGNOSIS — O321XX0 Maternal care for breech presentation, not applicable or unspecified: Secondary | ICD-10-CM | POA: Insufficient documentation

## 2021-07-05 DIAGNOSIS — Z363 Encounter for antenatal screening for malformations: Secondary | ICD-10-CM | POA: Insufficient documentation

## 2021-07-05 DIAGNOSIS — Z3A23 23 weeks gestation of pregnancy: Secondary | ICD-10-CM | POA: Insufficient documentation

## 2021-07-13 ENCOUNTER — Ambulatory Visit (INDEPENDENT_AMBULATORY_CARE_PROVIDER_SITE_OTHER): Payer: Medicaid Other | Admitting: Certified Nurse Midwife

## 2021-07-13 VITALS — BP 124/70 | HR 83 | Wt 154.0 lb

## 2021-07-13 DIAGNOSIS — Z3A24 24 weeks gestation of pregnancy: Secondary | ICD-10-CM

## 2021-07-13 DIAGNOSIS — Z3492 Encounter for supervision of normal pregnancy, unspecified, second trimester: Secondary | ICD-10-CM

## 2021-07-13 DIAGNOSIS — G5601 Carpal tunnel syndrome, right upper limb: Secondary | ICD-10-CM

## 2021-07-13 NOTE — Progress Notes (Signed)
ROB, c/o numbness in right hand and pressure.

## 2021-07-13 NOTE — Progress Notes (Signed)
   PRENATAL VISIT NOTE  Subjective:  Danielle Lowery is a 21 y.o. G1P0000 at [redacted]w[redacted]d being seen today for ongoing prenatal care.  She is currently monitored for the following issues for this low-risk pregnancy and has Encounter for supervision of normal first pregnancy in second trimester and Late prenatal care on their problem list.  Patient reports carpal tunnel symptoms.  Contractions: Irritability. Vag. Bleeding: None.  Movement: Present. Denies leaking of fluid.   The following portions of the patient's history were reviewed and updated as appropriate: allergies, current medications, past family history, past medical history, past social history, past surgical history and problem list.   Objective:   Vitals:   07/13/21 1131  BP: 124/70  Pulse: 83  Weight: 154 lb (69.9 kg)    Fetal Status: Fetal Heart Rate (bpm): 136   Movement: Present     General:  Alert, oriented and cooperative. Patient is in no acute distress.  Skin: Skin is warm and dry. No rash noted.   Cardiovascular: Normal heart rate noted  Respiratory: Normal respiratory effort, no problems with respiration noted  Abdomen: Soft, gravid, appropriate for gestational age.  Pain/Pressure: Present     Pelvic: Cervical exam deferred        Extremities: Normal range of motion.  Edema: None  Mental Status: Normal mood and affect. Normal behavior. Normal judgment and thought content.   Assessment and Plan:  Pregnancy: G1P0000 at [redacted]w[redacted]d 1. Encounter for supervision of low-risk pregnancy in second trimester - Doing well, feeling regular and vigorous fetal movement   2. [redacted] weeks gestation of pregnancy - Routine OB care including anticipatory guidance re GTT at next visit  3. Carpal tunnel syndrome of right wrist - Only in her right hand, falls asleep a lot while napping and then hurts. Showed her stretches to relieve carpal tunnel symptoms, can use a splint if needed  Preterm labor symptoms and general obstetric precautions  including but not limited to vaginal bleeding, contractions, leaking of fluid and fetal movement were reviewed in detail with the patient. Please refer to After Visit Summary for other counseling recommendations.   Return in about 4 weeks (around 08/10/2021) for IN-PERSON, LOB/GTT.  Future Appointments  Date Time Provider Department Center  08/10/2021  8:15 AM [facility] None  08/10/2021  8:55 AM Corlis Hove, NP [facility] None   Bernerd Limbo, CNM

## 2021-08-02 ENCOUNTER — Other Ambulatory Visit: Payer: Self-pay | Admitting: Obstetrics and Gynecology

## 2021-08-02 DIAGNOSIS — Z3402 Encounter for supervision of normal first pregnancy, second trimester: Secondary | ICD-10-CM

## 2021-08-04 ENCOUNTER — Telehealth: Payer: Self-pay | Admitting: Emergency Medicine

## 2021-08-04 ENCOUNTER — Other Ambulatory Visit (HOSPITAL_COMMUNITY)
Admission: RE | Admit: 2021-08-04 | Discharge: 2021-08-04 | Disposition: A | Discharge status: Home / Self Care | Payer: Medicaid Other | Source: Ambulatory Visit | Attending: Obstetrics and Gynecology | Admitting: Obstetrics and Gynecology

## 2021-08-04 ENCOUNTER — Ambulatory Visit (INDEPENDENT_AMBULATORY_CARE_PROVIDER_SITE_OTHER): Payer: Medicaid Other | Admitting: Obstetrics and Gynecology

## 2021-08-04 VITALS — BP 125/77 | HR 101 | Wt 159.0 lb

## 2021-08-04 DIAGNOSIS — R300 Dysuria: Secondary | ICD-10-CM

## 2021-08-04 DIAGNOSIS — N898 Other specified noninflammatory disorders of vagina: Secondary | ICD-10-CM

## 2021-08-04 DIAGNOSIS — Z3A27 27 weeks gestation of pregnancy: Secondary | ICD-10-CM

## 2021-08-04 DIAGNOSIS — Z3402 Encounter for supervision of normal first pregnancy, second trimester: Secondary | ICD-10-CM

## 2021-08-04 LAB — POCT URINALYSIS DIPSTICK OB
Bilirubin, UA: NEGATIVE
Glucose, UA: NEGATIVE
Nitrite, UA: NEGATIVE
Spec Grav, UA: 1.015 (ref 1.010–1.025)
Urobilinogen, UA: 0.2 E.U./dL
pH, UA: 6.5 (ref 5.0–8.0)

## 2021-08-04 MED ORDER — PHENAZOPYRIDINE HCL 200 MG PO TABS: 200.0000 mg | ORAL_TABLET | Freq: Three times a day (TID) | ORAL | 1 refills | Status: DC

## 2021-08-04 NOTE — Progress Notes (Signed)
Pt seen after nurse visit due to complaint of vaginal bleeding for the last week Denies any cramps or ut ctx No LOF or recent IC Reports good FM  PE  AF VSS SSE no vaginal bleeding noted, cervix LTC + bladder tenderness  UA + RBC and WBC  A/P IUP        Vaginal bleeding       PE findings reviewed with pt. No evidence of vaginal bleeding. Suspect pt is sees hematuria Will check UC. Pyridium in the meantime. PTL precautions reviewed. Keep routine OB apt next week

## 2021-08-04 NOTE — Progress Notes (Unsigned)
Pt presents with c/o red blood when wiping x1 week. Also reports dysuria, vaginal itching and irritation. Endorses low back pain. Denies vaginal odor, vaginal discharge, pelvic pain, and abdominal cramping.

## 2021-08-04 NOTE — Telephone Encounter (Signed)
To see provider.

## 2021-08-04 NOTE — Patient Instructions (Signed)

## 2021-08-06 LAB — URINE CULTURE, OB REFLEX

## 2021-08-06 LAB — CULTURE, OB URINE

## 2021-08-07 ENCOUNTER — Other Ambulatory Visit: Payer: Self-pay | Admitting: *Deleted

## 2021-08-07 ENCOUNTER — Telehealth: Payer: Self-pay

## 2021-08-07 DIAGNOSIS — B3731 Acute candidiasis of vulva and vagina: Secondary | ICD-10-CM

## 2021-08-07 DIAGNOSIS — B9689 Other specified bacterial agents as the cause of diseases classified elsewhere: Secondary | ICD-10-CM

## 2021-08-07 LAB — CERVICOVAGINAL ANCILLARY ONLY
Bacterial Vaginitis (gardnerella): POSITIVE — AB
Candida Glabrata: NEGATIVE
Candida Vaginitis: POSITIVE — AB
Chlamydia: NEGATIVE
Comment: NEGATIVE
Comment: NEGATIVE
Comment: NEGATIVE
Comment: NEGATIVE
Comment: NEGATIVE
Comment: NORMAL
Neisseria Gonorrhea: NEGATIVE
Trichomonas: NEGATIVE

## 2021-08-07 MED ORDER — TERCONAZOLE 0.8 % VA CREA: 1.0000 | TOPICAL_CREAM | Freq: Every day | VAGINAL | 0 refills | Status: DC

## 2021-08-07 MED ORDER — METRONIDAZOLE 500 MG PO TABS: 500.0000 mg | ORAL_TABLET | Freq: Two times a day (BID) | ORAL | 0 refills | Status: DC

## 2021-08-07 NOTE — Telephone Encounter (Signed)
I connected with  Ashok Croon on 08/07/21 by telephone and verified that I am speaking with the correct person using two identifiers.   Pt states she is feeling better, informed pt she does not have a UTI.   Pt voices understanding at this time.

## 2021-08-07 NOTE — Telephone Encounter (Signed)
-----   Message from Hermina Staggers, MD sent at 08/07/2021  9:06 AM EDT ----- Please call pt and see how she is feeling. Let her know that her urine culture was negative.  Thanks Casimiro Needle

## 2021-08-07 NOTE — Progress Notes (Signed)
Terazole and Flagyl sent per protocol.

## 2021-08-10 ENCOUNTER — Ambulatory Visit (INDEPENDENT_AMBULATORY_CARE_PROVIDER_SITE_OTHER): Payer: Medicaid Other | Admitting: Student

## 2021-08-10 ENCOUNTER — Encounter: Payer: Self-pay | Admitting: Student

## 2021-08-10 ENCOUNTER — Other Ambulatory Visit: Payer: Medicaid Other

## 2021-08-10 VITALS — BP 126/74 | HR 91 | Wt 162.2 lb

## 2021-08-10 DIAGNOSIS — Z3A28 28 weeks gestation of pregnancy: Secondary | ICD-10-CM | POA: Diagnosis not present

## 2021-08-10 DIAGNOSIS — O0930 Supervision of pregnancy with insufficient antenatal care, unspecified trimester: Secondary | ICD-10-CM

## 2021-08-10 DIAGNOSIS — Z23 Encounter for immunization: Secondary | ICD-10-CM | POA: Diagnosis not present

## 2021-08-10 DIAGNOSIS — Z3402 Encounter for supervision of normal first pregnancy, second trimester: Secondary | ICD-10-CM

## 2021-08-10 NOTE — Progress Notes (Signed)
Pt presents for ROB visit. No concerns at this time.  

## 2021-08-10 NOTE — Progress Notes (Signed)
   PRENATAL VISIT NOTE  Subjective:  Danielle Lowery is a 21 y.o. G1P0000 at [redacted]w[redacted]d being seen today for ongoing prenatal care.  She is currently monitored for the following issues for this low-risk pregnancy and has Encounter for supervision of normal first pregnancy in second trimester and Late prenatal care on their problem list.  Patient reports no complaints.  Contractions: Not present. Vag. Bleeding: None.  Movement: Present. Denies leaking of fluid.   The following portions of the patient's history were reviewed and updated as appropriate: allergies, current medications, past family history, past medical history, past social history, past surgical history and problem list.   Objective:   Vitals:   08/10/21 0823  BP: 126/74  Pulse: 91  Weight: 162 lb 3.2 oz (73.6 kg)    Fetal Status: Fetal Heart Rate (bpm): 140 Fundal Height: 28 cm Movement: Present     General:  Alert, oriented and cooperative. Patient is in no acute distress.  Skin: Skin is warm and dry. No rash noted.   Cardiovascular: Normal heart rate noted  Respiratory: Normal respiratory effort, no problems with respiration noted  Abdomen: Soft, gravid, appropriate for gestational age.  Pain/Pressure: Absent     Pelvic: Cervical exam deferred        Extremities: Normal range of motion.  Edema: None  Mental Status: Normal mood and affect. Normal behavior. Normal judgment and thought content.   Assessment and Plan:  Pregnancy: G1P0000 at [redacted]w[redacted]d 1. Encounter for supervision of normal first pregnancy in second trimester - Doing well, no questions today - Reports fetal movement - Patient reports deciding on Pediatric group, but unsure of name at today's visit  2. [redacted] weeks gestation of pregnancy - Glucose Tolerance, 2 Hours w/1 Hour - HIV antibody (with reflex) - RPR - CBC - Tdap vaccine greater than or equal to 7yo IM  3. Late prenatal care   Preterm labor symptoms and general obstetric precautions including but  not limited to vaginal bleeding, contractions, leaking of fluid and fetal movement were reviewed in detail with the patient. Please refer to After Visit Summary for other counseling recommendations.   Return in about 2 weeks (around 08/24/2021) for LOB, IN-PERSON.  No future appointments.  Corlis Hove, NP

## 2021-08-11 LAB — GLUCOSE TOLERANCE, 2 HOURS W/ 1HR
Glucose, 1 hour: 173 mg/dL (ref 70–179)
Glucose, 2 hour: 115 mg/dL (ref 70–152)
Glucose, Fasting: 73 mg/dL (ref 70–91)

## 2021-08-11 LAB — RPR: RPR Ser Ql: NONREACTIVE

## 2021-08-11 LAB — CBC
Hematocrit: 34.4 % (ref 34.0–46.6)
Hemoglobin: 11.4 g/dL (ref 11.1–15.9)
MCH: 29.6 pg (ref 26.6–33.0)
MCHC: 33.1 g/dL (ref 31.5–35.7)
MCV: 89 fL (ref 79–97)
Platelets: 292 10*3/uL (ref 150–450)
RBC: 3.85 x10E6/uL (ref 3.77–5.28)
RDW: 12.3 % (ref 11.7–15.4)
WBC: 11.8 10*3/uL — ABNORMAL HIGH (ref 3.4–10.8)

## 2021-08-11 LAB — HIV ANTIBODY (ROUTINE TESTING W REFLEX): HIV Screen 4th Generation wRfx: NONREACTIVE

## 2021-08-24 ENCOUNTER — Telehealth: Payer: Self-pay

## 2021-08-24 NOTE — Telephone Encounter (Signed)
Patient called complaining of having what looks like cuts in her vagina. Cuts were first noticed yesterday. States that she last had intercourse 2-3 weeks ago. Denies shaving recently or scratching.. She states that the areas does not look like bumps. States that she does have some vaginal discharge, no odor, or irritation. Denies any exposure to STD. Denies any drainage. States that it is very painful to walk. Endorses good fetal movement. Denies bleeding or LOF. Denies cramping or contractions.   Patient advised to go to urgent care to be evaluated.

## 2021-08-25 ENCOUNTER — Ambulatory Visit (INDEPENDENT_AMBULATORY_CARE_PROVIDER_SITE_OTHER): Payer: Medicaid Other

## 2021-08-25 ENCOUNTER — Other Ambulatory Visit (HOSPITAL_COMMUNITY)
Admission: RE | Admit: 2021-08-25 | Discharge: 2021-08-25 | Disposition: A | Discharge status: Home / Self Care | Payer: Medicaid Other | Source: Ambulatory Visit | Attending: Obstetrics and Gynecology | Admitting: Obstetrics and Gynecology

## 2021-08-25 VITALS — BP 116/76 | HR 106 | Ht 59.0 in | Wt 164.7 lb

## 2021-08-25 DIAGNOSIS — N898 Other specified noninflammatory disorders of vagina: Secondary | ICD-10-CM | POA: Diagnosis present

## 2021-08-25 MED ORDER — FLUCONAZOLE 150 MG PO TABS: 150.0000 mg | ORAL_TABLET | Freq: Once | ORAL | 0 refills | Status: AC

## 2021-08-25 NOTE — Progress Notes (Signed)
Agree with nurses's documentation of this patient's clinic encounter.  Bentli Llorente L, MD  

## 2021-08-25 NOTE — Progress Notes (Signed)
SUBJECTIVE:  21 y.o. female complains of white vaginal discharge for 3 day(s). Denies abnormal vaginal bleeding or significant pelvic pain or fever. No UTI symptoms. Denies history of known exposure to STD.   Patient's last menstrual period was 01/23/2021.  OBJECTIVE:  She appears well, afebrile. Urine dipstick: not done.  ASSESSMENT:  Vaginal Discharge     PLAN:  GC, chlamydia, trichomonas, BVAG, CVAG probe sent to lab. Treatment: To be determined once lab results are received ROV prn if symptoms persist or worsen.

## 2021-08-26 ENCOUNTER — Inpatient Hospital Stay (HOSPITAL_COMMUNITY)
Admission: AD | Admit: 2021-08-26 | Discharge: 2021-08-26 | Disposition: A | Discharge status: Home / Self Care | Payer: Medicaid Other | Attending: Family Medicine | Admitting: Family Medicine

## 2021-08-26 ENCOUNTER — Encounter (HOSPITAL_COMMUNITY): Payer: Self-pay | Admitting: Family Medicine

## 2021-08-26 DIAGNOSIS — R102 Pelvic and perineal pain: Secondary | ICD-10-CM | POA: Diagnosis present

## 2021-08-26 DIAGNOSIS — A6009 Herpesviral infection of other urogenital tract: Secondary | ICD-10-CM | POA: Diagnosis not present

## 2021-08-26 DIAGNOSIS — O26893 Other specified pregnancy related conditions, third trimester: Secondary | ICD-10-CM | POA: Diagnosis present

## 2021-08-26 DIAGNOSIS — Z3A30 30 weeks gestation of pregnancy: Secondary | ICD-10-CM | POA: Diagnosis not present

## 2021-08-26 DIAGNOSIS — A6000 Herpesviral infection of urogenital system, unspecified: Secondary | ICD-10-CM | POA: Insufficient documentation

## 2021-08-26 DIAGNOSIS — Z79624 Long term (current) use of inhibitors of nucleotide synthesis: Secondary | ICD-10-CM | POA: Diagnosis not present

## 2021-08-26 DIAGNOSIS — O98513 Other viral diseases complicating pregnancy, third trimester: Secondary | ICD-10-CM | POA: Diagnosis not present

## 2021-08-26 DIAGNOSIS — O98319 Other infections with a predominantly sexual mode of transmission complicating pregnancy, unspecified trimester: Secondary | ICD-10-CM | POA: Diagnosis not present

## 2021-08-26 HISTORY — DX: Herpesviral infection of other urogenital tract: A60.09

## 2021-08-26 MED ORDER — LIDOCAINE HCL URETHRAL/MUCOSAL 2 % EX GEL
1.0000 | Freq: Once | CUTANEOUS | Status: AC
  Administered 2021-08-26: 1 via TOPICAL
  Filled 2021-08-26: qty 6

## 2021-08-26 MED ORDER — VALACYCLOVIR HCL 500 MG PO TABS: 500.0000 mg | ORAL_TABLET | Freq: Two times a day (BID) | ORAL | 6 refills | Status: DC

## 2021-08-26 NOTE — MAU Note (Signed)
Danielle Lowery is a 21 y.o. at [redacted]w[redacted]d here in MAU reporting: has a cut at the top of her vagina, clarified location, ? Near clitoris.  First noted on Tues.  Hurts to sit. There is pus coming out.  No other spots noted. No abd pain, no vag bleeding or LOF.  Reports +FM Onset of complaint: Tue Pain score: 8 There were no vitals filed for this visit.   FHT:160 Lab orders placed from triage:     Was at [facility] yesterday- not assessed, swab tests only

## 2021-08-26 NOTE — MAU Provider Note (Signed)
History     [id number]  Arrival date and time: 08/26/21 9024    Chief Complaint  Patient presents with   perineal pain     HPI Danielle Lowery is a 21 y.o. at 25w5dwho presents for vaginal pain. Symptoms started on Tuesday. Went to Urgent Care on Thursday & was told she had herpes (results pending) and was started on Valtrex. Took dose on Thursday but not since. States she knows she doesn't have herpes as she's only been with her husband & he doesn't have it. He denies history of oral or penile lesions.  She reports increase in clear/white discharge & vaginal pain that is progressively worsening, making it difficult to sit. Denies fever, abdominal pain, dysuria, or vaginal bleeding. Positive fetal movement.    OB History     Gravida  1   Para  0   Term  0   Preterm  0   AB  0   Living  0      SAB  0   IAB  0   Ectopic  0   Multiple  0   Live Births  0           History reviewed. No pertinent past medical history.  Past Surgical History:  Procedure Laterality Date   DENTAL SURGERY  2015    Family History  Problem Relation Age of Onset   Diabetes Mother    Diabetes Father    Diabetes Maternal Grandmother    Diabetes Maternal Grandfather    Diabetes Paternal Grandmother    Cancer Paternal Grandfather     No Known Allergies  No current facility-administered medications on file prior to encounter.   Current Outpatient Medications on File Prior to Encounter  Medication Sig Dispense Refill   valACYclovir (VALTREX) 1000 MG tablet Take by mouth.     Blood Pressure Monitoring (BLOOD PRESSURE KIT) DEVI 1 Device by Does not apply route once a week. (Patient not taking: Reported on 08/26/2021) 1 each 0   metroNIDAZOLE (FLAGYL) 500 MG tablet Take 1 tablet (500 mg total) by mouth 2 (two) times daily. (Patient not taking: Reported on 08/25/2021) 14 tablet 0   phenazopyridine (PYRIDIUM) 200 MG tablet Take 1 tablet (200 mg total) by mouth 3 (three) times  daily with meals. (Patient not taking: Reported on 08/10/2021) 15 tablet 1   Prenatal MV & Min w/FA-DHA (PRENATAL GUMMIES) 0.18-25 MG CHEW Chew 1 tablet by mouth daily. 30 tablet 11   terconazole (TERAZOL 3) 0.8 % vaginal cream Place 1 applicator vaginally at bedtime. Apply nightly for three nights. (Patient not taking: Reported on 08/10/2021) 20 g 0   [DISCONTINUED] famotidine (PEPCID) 20 MG tablet Take 1 tablet (20 mg total) by mouth 2 (two) times daily. 30 tablet 0     ROS Pertinent positives and negative per HPI, all others reviewed and negative  Physical Exam   BP 132/79 (BP Location: Right Arm)   Pulse (!) 117   Temp 98.2 F (36.8 C) (Oral)   Resp 18   Ht 4' 11"  (1.499 m)   Wt 75.4 kg   LMP 01/23/2021   SpO2 100%   BMI 33.57 kg/m   Patient Vitals for the past 24 hrs:  BP Temp Temp src Pulse Resp SpO2 Height Weight  08/26/21 0944 132/79 98.2 F (36.8 C) Oral (!) 117 18 100 % 4' 11"  (1.499 m) 75.4 kg    Physical Exam Vitals and nursing note reviewed. Exam conducted with a chaperone present.  Constitutional:      General: She is not in acute distress.    Appearance: Normal appearance.  HENT:     Head: Normocephalic and atraumatic.  Genitourinary:    Labia:        Right: Lesion present.        Left: Lesion present.      Comments: Multiple small vesicles on bilateral labia & several areas of ulceration. Moderate amount of white mucoid discharge.  Skin:    General: Skin is warm and dry.  Neurological:     Mental Status: She is alert.      Labs No results found for this or any previous visit (from the past 24 hour(s)).  Imaging No results found.  MAU Course  Procedures Lab Orders         HSV(herpes simplex vrs) 1+2 ab-IgG     Meds ordered this encounter  Medications   lidocaine (XYLOCAINE) 2 % jelly 1 Application   valACYclovir (VALTREX) 500 MG tablet    Sig: Take 1 tablet (500 mg total) by mouth 2 (two) times daily.    Dispense:  60 tablet    Refill:  6     Order Specific Question:   Supervising Provider    Answer:   Donnamae Jude [[id number]]   Imaging Orders  No imaging studies ordered today    MDM Reviewed records in Care everywhere - patient seen at urgent care on 8/17 - exam consistent with HSV - was presribed valtrex & HSV PCR collected  HSV IGG ordered per consult with Dr. Kennon Rounds to determine if primary outbreak  Discussed with patient that current exam is highly suspicious for HSV & we will treat it as that even though her results are pending.  Assessment and Plan   1. 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 [redacted] weeks gestation of pregnancy    -labs pending -Patient to continue valtrex 1 gm BID x 10 days then will start suppressive dosing which was prescribed today -Lidocaine gel applied for patient comfort - will send remaining gel home with patient to use prn  [name], NP 08/26/21 11:53 AM

## 2021-08-28 LAB — CERVICOVAGINAL ANCILLARY ONLY
Bacterial Vaginitis (gardnerella): POSITIVE — AB
Candida Glabrata: NEGATIVE
Candida Vaginitis: POSITIVE — AB
Chlamydia: NEGATIVE
Comment: NEGATIVE
Comment: NEGATIVE
Comment: NEGATIVE
Comment: NEGATIVE
Comment: NEGATIVE
Comment: NORMAL
Neisseria Gonorrhea: NEGATIVE
Trichomonas: NEGATIVE

## 2021-08-28 LAB — HSV(HERPES SIMPLEX VRS) I + II AB-IGG
HSV 1 Glycoprotein G Ab, IgG: <0.91 index (ref 0.00–0.90)
HSV 2 Glycoprotein G Ab, IgG: <0.91 index (ref 0.00–0.90)

## 2021-08-30 ENCOUNTER — Other Ambulatory Visit: Payer: Self-pay

## 2021-08-30 ENCOUNTER — Ambulatory Visit (INDEPENDENT_AMBULATORY_CARE_PROVIDER_SITE_OTHER): Payer: Medicaid Other | Admitting: Obstetrics & Gynecology

## 2021-08-30 ENCOUNTER — Other Ambulatory Visit: Payer: Self-pay | Admitting: Obstetrics and Gynecology

## 2021-08-30 VITALS — BP 130/80 | HR 106 | Wt 168.5 lb

## 2021-08-30 DIAGNOSIS — Z3402 Encounter for supervision of normal first pregnancy, second trimester: Secondary | ICD-10-CM

## 2021-08-30 DIAGNOSIS — O98319 Other infections with a predominantly sexual mode of transmission complicating pregnancy, unspecified trimester: Secondary | ICD-10-CM

## 2021-08-30 DIAGNOSIS — A6009 Herpesviral infection of other urogenital tract: Secondary | ICD-10-CM

## 2021-08-30 DIAGNOSIS — B3731 Acute candidiasis of vulva and vagina: Secondary | ICD-10-CM

## 2021-08-30 DIAGNOSIS — B9689 Other specified bacterial agents as the cause of diseases classified elsewhere: Secondary | ICD-10-CM

## 2021-08-30 MED ORDER — BLOOD PRESSURE KIT DEVI: 1.0000 | 0 refills | Status: DC

## 2021-08-30 MED ORDER — METRONIDAZOLE 500 MG PO TABS: 500.0000 mg | ORAL_TABLET | Freq: Two times a day (BID) | ORAL | 0 refills | Status: DC

## 2021-08-30 MED ORDER — TERCONAZOLE 0.4 % VA CREA: 1.0000 | TOPICAL_CREAM | Freq: Every day | VAGINAL | 0 refills | Status: DC

## 2021-08-30 NOTE — Progress Notes (Signed)
Pt reports fetal movement, denies pain today.

## 2021-08-30 NOTE — Progress Notes (Signed)
   PRENATAL VISIT NOTE  Subjective:  Danielle Lowery is a 21 y.o. G1P0000 at [redacted]w[redacted]d being seen today for ongoing prenatal care.  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  Patient reports no complaints.  Contractions: Not present. Vag. Bleeding: None.  Movement: Present. Denies leaking of fluid.   The following portions of the patient's history were reviewed and updated as appropriate: allergies, current medications, past family history, past medical history, past social history, past surgical history and problem list.   Objective:   Vitals:   08/30/21 0856  BP: 130/80  Pulse: (!) 106  Weight: 168 lb 8 oz (76.4 kg)    Fetal Status: Fetal Heart Rate (bpm): 135   Movement: Present     General:  Alert, oriented and cooperative. Patient is in no acute distress.  Skin: Skin is warm and dry. No rash noted.   Cardiovascular: Normal heart rate noted  Respiratory: Normal respiratory effort, no problems with respiration noted  Abdomen: Soft, gravid, appropriate for gestational age.  Pain/Pressure: Absent     Pelvic: Cervical exam deferred        Extremities: Normal range of motion.  Edema: None  Mental Status: Normal mood and affect. Normal behavior. Normal judgment and thought content.   Assessment and Plan:  Pregnancy: G1P0000 at [redacted]w[redacted]d 1. Encounter for supervision of normal first pregnancy in second trimester [redacted]w[redacted]d  - Blood Pressure Monitoring (BLOOD PRESSURE KIT) DEVI; 1 Device by Does not apply route once a week.  Dispense: 1 each; Refill: 0  2. 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 Please refer to After Visit Summary for other counseling recommendations.   Return in about 2 weeks (around 09/13/2021).  Future Appointments  Date Time Provider [place]  09/13/2021  9:55 AM Leftwich-Kirby, Kathie Dike, CNM [facility] None  09/27/2021  8:55 AM Johnston Ebbs, NP [place] None  10/09/2021  9:35 AM Constant, Vickii Chafe, MD [place] None  10/16/2021  8:55 AM Constant, Vickii Chafe, MD [place] None  10/23/2021  9:35 AM Leftwich-Kirby, Kathie Dike, CNM [facility] None  10/30/2021  8:55 AM Constant, Vickii Chafe, MD [facility] None    Emeterio Reeve, MD

## 2021-08-30 NOTE — Progress Notes (Signed)
Called patient and let her know we called in a prescription for her BV and yeast. Patient had no questions or concerns.

## 2021-09-13 ENCOUNTER — Ambulatory Visit (INDEPENDENT_AMBULATORY_CARE_PROVIDER_SITE_OTHER): Payer: Medicaid Other | Admitting: Advanced Practice Midwife

## 2021-09-13 VITALS — BP 134/76 | HR 103 | Wt 172.2 lb

## 2021-09-13 DIAGNOSIS — Z3403 Encounter for supervision of normal first pregnancy, third trimester: Secondary | ICD-10-CM

## 2021-09-13 DIAGNOSIS — A6000 Herpesviral infection of urogenital system, unspecified: Secondary | ICD-10-CM

## 2021-09-13 DIAGNOSIS — O98313 Other infections with a predominantly sexual mode of transmission complicating pregnancy, third trimester: Secondary | ICD-10-CM

## 2021-09-13 DIAGNOSIS — A6009 Herpesviral infection of other urogenital tract: Secondary | ICD-10-CM

## 2021-09-13 DIAGNOSIS — Z3A33 33 weeks gestation of pregnancy: Secondary | ICD-10-CM

## 2021-09-13 DIAGNOSIS — Z3402 Encounter for supervision of normal first pregnancy, second trimester: Secondary | ICD-10-CM

## 2021-09-13 NOTE — Progress Notes (Signed)
Pt reports fetal movement, denies pain.  

## 2021-09-13 NOTE — Progress Notes (Signed)
   PRENATAL VISIT NOTE  Subjective:  Danielle Lowery is a 21 y.o. G1P0000 at [redacted]w[redacted]d being seen today for ongoing prenatal care.  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  Patient reports  occasional lower abdominal/groin pain .  Contractions: Not present. Vag. Bleeding: None.  Movement: Present. Denies leaking of fluid.   The following portions of the patient's history were reviewed and updated as appropriate: allergies, current medications, past family history, past medical history, past social history, past surgical history and problem list.   Objective:   Vitals:   09/13/21 1008  BP: 134/76  Pulse: (!) 103  Weight: 172 lb 3.2 oz (78.1 kg)    Fetal Status: Fetal Heart Rate (bpm): 140   Movement: Present     General:  Alert, oriented and cooperative. Patient is in no acute distress.  Skin: Skin is warm and dry. No rash noted.   Cardiovascular: Normal heart rate noted  Respiratory: Normal respiratory effort, no problems with respiration noted  Abdomen: Soft, gravid, appropriate for gestational age.  Pain/Pressure: Absent     Pelvic: Cervical exam deferred        Extremities: Normal range of motion.  Edema: None  Mental Status: Normal mood and affect. Normal behavior. Normal judgment and thought content.   Assessment and Plan:  Pregnancy: G1P0000 at [redacted]w[redacted]d 1. Primary genital herpes simplex infection --primary outbreak at [redacted]w[redacted]d. Pt completed Valtrex tx dose and is taking 500 mg BID for pregnancy suppression.  --Lesions resolved, no symptoms today. --Consult Dr Macon Large, MFM consult to assist with delivery decision --Discussed with pt today that she may need a primary c/s due to the recent HSV outbreak. Pt had already discussed this with provider  when diagnosed and has read on the internet that this is recommended. She states understanding and agrees with plan of care.   - AMB referral to maternal fetal medicine  2. Genital herpes affecting pregnancy in third trimester  - AMB referral to maternal fetal medicine  3. Encounter for supervision of normal first pregnancy in second trimester --Anticipatory guidance about next visits/weeks of pregnancy given.   4. [redacted] weeks gestation of pregnancy   Preterm labor symptoms and general obstetric precautions including but not limited to vaginal bleeding, contractions, leaking of fluid and fetal movement were reviewed in detail with the patient. Please refer to After Visit Summary for other counseling recommendations.   Return in about 2 weeks (around 09/27/2021) for LOB, Any provider.  Future Appointments  Date Time Provider Department Center  09/18/2021  9:45 AM [facility] NURSE [facility]  09/18/2021 10:00 AM [facility] MD [facility]  09/27/2021  8:55 AM Corlis Hove, NP [facility] None  10/09/2021  9:35 AM Constant, Gigi Gin, MD [facility] None  10/16/2021  8:55 AM Constant, Gigi Gin, MD [facility] None  10/23/2021  9:35 AM Leftwich-Kirby, Wilmer Floor, CNM [facility] None  10/30/2021  8:55 AM Constant, Gigi Gin, MD [facility] None    Sharen Counter, CNM

## 2021-09-15 ENCOUNTER — Encounter: Payer: Self-pay | Admitting: Advanced Practice Midwife

## 2021-09-18 ENCOUNTER — Ambulatory Visit: Payer: Medicaid Other

## 2021-09-18 ENCOUNTER — Encounter: Payer: Self-pay | Admitting: *Deleted

## 2021-09-18 ENCOUNTER — Other Ambulatory Visit: Payer: Self-pay | Admitting: *Deleted

## 2021-09-18 ENCOUNTER — Ambulatory Visit: Payer: Medicaid Other | Attending: Advanced Practice Midwife | Admitting: Obstetrics and Gynecology

## 2021-09-18 ENCOUNTER — Ambulatory Visit: Payer: Medicaid Other | Admitting: *Deleted

## 2021-09-18 ENCOUNTER — Ambulatory Visit (HOSPITAL_BASED_OUTPATIENT_CLINIC_OR_DEPARTMENT_OTHER): Payer: Medicaid Other

## 2021-09-18 VITALS — BP 130/79 | HR 99

## 2021-09-18 DIAGNOSIS — Z3A34 34 weeks gestation of pregnancy: Secondary | ICD-10-CM

## 2021-09-18 DIAGNOSIS — O98313 Other infections with a predominantly sexual mode of transmission complicating pregnancy, third trimester: Secondary | ICD-10-CM | POA: Insufficient documentation

## 2021-09-18 DIAGNOSIS — B009 Herpesviral infection, unspecified: Secondary | ICD-10-CM | POA: Insufficient documentation

## 2021-09-18 DIAGNOSIS — A6009 Herpesviral infection of other urogenital tract: Secondary | ICD-10-CM | POA: Insufficient documentation

## 2021-09-18 DIAGNOSIS — O98513 Other viral diseases complicating pregnancy, third trimester: Secondary | ICD-10-CM | POA: Insufficient documentation

## 2021-09-18 DIAGNOSIS — O0930 Supervision of pregnancy with insufficient antenatal care, unspecified trimester: Secondary | ICD-10-CM

## 2021-09-18 DIAGNOSIS — O0933 Supervision of pregnancy with insufficient antenatal care, third trimester: Secondary | ICD-10-CM

## 2021-09-18 NOTE — Progress Notes (Signed)
Maternal-Fetal Medicine   Name: Danielle Lowery DOB: 03/20/2000 MRN: [id number] Referring Provider: Ugonna Anyanwu, MD  I had the pleasure of seeing Ms. Danielle Lowery today at the [facility]. She is G1 P0 at 34-weeks' gestation and is here for consultation to discuss mode of delivery.  She has primary HSV infection that was diagnosed about 3 weeks ago.  On clinical examination, she had painful vesicular lesions at the introitus insistent with genital herpes.  PCR showed HSV-1 1 infection.  Pleated treatment and now takes suppressive therapy with Valtrex 500 mg twice daily.  Her pregnancy has been otherwise normal.  On cell-free fetal DNA screening, the risks of fetal aneuploidies are not increased.  MSAFP screening showed low risk for open neural tube defects.  She does not have gestational diabetes. Blood pressure today at her office is 130/79 mmHg.  Past medical history: No history of diabetes or hypertension. Past surgical history: Dental surgery. Allergies: No known drug allergies. Medications: Prenatal vitamins, Valtrex 500 mg twice daily. Social history: Denies tobacco or drug or alcohol use.  Her partner is in good health and he does not have active genital herpes. Family history: No history of venous thromboembolism in the family.  Ultrasound was performed after consultation with the patient returned later in the afternoon. Fetal growth is appropriate for gestational age.  Amniotic fluid is normal and good fetal activity seen.  Cephalic presentation.  Primary genital herpes infection: -If delivery occurs during active infection, the risk of perinatal transmission is from 30% to 60%.  Although transplacental transmission has been reported, it is very rare.  -Neonatal herpes infection can be serious and is associated with neurological complications and increased mortality. Disseminated neonatal HSV infection is serious and can lead to severe neurological  sequelae.  -After treatment of primary infection, if recurrent lesions occur, the transmission rate falls to about 3%.  -Viral shedding can be asymptomatic, but in the absence of lesion, perinatal transmission is extremely rare (2 per 10,000). However, if the interval between primary infection and delivery is short (less than 6 weeks), the perinatal transmission rate is higher. Viral shedding has been reported in up to 70% of the cases with recent primary HSV infection. -European guidelines recommend considering cesarean delivery if primary infection has occurred in the third trimester 6 weeks before delivery. Cesarean delivery does not completely prevent neonatal infection. ACOG recommends considering cesarean delivery if primary infection has occurred during the third trimester (ACOG Practice Bulletin, Vol 135, No. 5, May 2020).  -Suppressive therapy with valacyclovir should continue throughout her pregnancy now.  -Valacyclovir has not been associated with any adverse fetal and neonatal outcomes. Here the benefit of treatment outweighs the risks.  Since primary HSV infection has occurred in the third trimester, I recommended elective cesarean delivery to reduce perinatal transmission.  If membrane ruptures before delivery, the risk of transmission is slightly increased.  I recommended cesarean delivery at [redacted] weeks gestation.  Patient agreed with my recommendations.  Recommendations -Elective cesarean delivery at [redacted] weeks gestation. -Continue suppressive treatment.  Thank you for consultation.  If you have any questions or concerns, please contact me the [facility].  Consultation including face-to-face (more than 50%) counseling 30 minutes.   

## 2021-09-18 NOTE — Progress Notes (Unsigned)
Maternal-Fetal Medicine   Name: Danielle Lowery DOB: 2000/03/29 MRN: [id number] Referring Provider: Jaynie Collins, MD  I had the pleasure of seeing Danielle Lowery today at the [facility]. She is G1 P0 at 60-weeks' gestation and is here for consultation to discuss mode of delivery.  She has primary HSV infection that was diagnosed about 3 weeks ago.  On clinical examination, she had painful vesicular lesions at the introitus insistent with genital herpes.  PCR showed HSV-1 1 infection.  Pleated treatment and now takes suppressive therapy with Valtrex 500 mg twice daily.  Her pregnancy has been otherwise normal.  On cell-free fetal DNA screening, the risks of fetal aneuploidies are not increased.  MSAFP screening showed low risk for open neural tube defects.  She does not have gestational diabetes. Blood pressure today at her office is 130/79 mmHg.  Past medical history: No history of diabetes or hypertension. Past surgical history: Dental surgery. Allergies: No known drug allergies. Medications: Prenatal vitamins, Valtrex 500 mg twice daily. Social history: Denies tobacco or drug or alcohol use.  Her partner is in good health and he does not have active genital herpes. Family history: No history of venous thromboembolism in the family.  Ultrasound was performed after consultation with the patient returned later in the afternoon. Fetal growth is appropriate for gestational age.  Amniotic fluid is normal and good fetal activity seen.  Cephalic presentation.  Primary genital herpes infection: -If delivery occurs during active infection, the risk of perinatal transmission is from 30% to 60%.  Although transplacental transmission has been reported, it is very rare.  -Neonatal herpes infection can be serious and is associated with neurological complications and increased mortality. Disseminated neonatal HSV infection is serious and can lead to severe neurological  sequelae.  -After treatment of primary infection, if recurrent lesions occur, the transmission rate falls to about 3%.  -Viral shedding can be asymptomatic, but in the absence of lesion, perinatal transmission is extremely rare (2 per 10,000). However, if the interval between primary infection and delivery is short (less than 6 weeks), the perinatal transmission rate is higher. Viral shedding has been reported in up to 70% of the cases with recent primary HSV infection. -European guidelines recommend considering cesarean delivery if primary infection has occurred in the third trimester 6 weeks before delivery. Cesarean delivery does not completely prevent neonatal infection. ACOG recommends considering cesarean delivery if primary infection has occurred during the third trimester (ACOG Practice Bulletin, Vol 135, No. 5, May 2020).  -Suppressive therapy with valacyclovir should continue throughout her pregnancy now.  -Valacyclovir has not been associated with any adverse fetal and neonatal outcomes. Here the benefit of treatment outweighs the risks.  Since primary HSV infection has occurred in the third trimester, I recommended elective cesarean delivery to reduce perinatal transmission.  If membrane ruptures before delivery, the risk of transmission is slightly increased.  I recommended cesarean delivery at [redacted] weeks gestation.  Patient agreed with my recommendations.  Recommendations -Elective cesarean delivery at [redacted] weeks gestation. -Continue suppressive treatment.  Thank you for consultation.  If you have any questions or concerns, please contact me the [facility].  Consultation including face-to-face (more than 50%) counseling 30 minutes.

## 2021-09-27 ENCOUNTER — Other Ambulatory Visit (HOSPITAL_COMMUNITY)
Admission: RE | Admit: 2021-09-27 | Discharge: 2021-09-27 | Disposition: A | Discharge status: Home / Self Care | Payer: Medicaid Other | Source: Ambulatory Visit | Attending: Student | Admitting: Student

## 2021-09-27 ENCOUNTER — Ambulatory Visit (INDEPENDENT_AMBULATORY_CARE_PROVIDER_SITE_OTHER): Payer: Medicaid Other | Admitting: Student

## 2021-09-27 VITALS — BP 132/79 | HR 104 | Wt 178.6 lb

## 2021-09-27 DIAGNOSIS — A6009 Herpesviral infection of other urogenital tract: Secondary | ICD-10-CM

## 2021-09-27 DIAGNOSIS — Z3A35 35 weeks gestation of pregnancy: Secondary | ICD-10-CM | POA: Insufficient documentation

## 2021-09-27 DIAGNOSIS — O98313 Other infections with a predominantly sexual mode of transmission complicating pregnancy, third trimester: Secondary | ICD-10-CM

## 2021-09-27 DIAGNOSIS — Z3403 Encounter for supervision of normal first pregnancy, third trimester: Secondary | ICD-10-CM

## 2021-09-27 DIAGNOSIS — O98319 Other infections with a predominantly sexual mode of transmission complicating pregnancy, unspecified trimester: Secondary | ICD-10-CM

## 2021-09-27 DIAGNOSIS — O0933 Supervision of pregnancy with insufficient antenatal care, third trimester: Secondary | ICD-10-CM

## 2021-09-27 DIAGNOSIS — O0930 Supervision of pregnancy with insufficient antenatal care, unspecified trimester: Secondary | ICD-10-CM

## 2021-09-27 NOTE — Progress Notes (Signed)
Pt reports fetal movement with some pressure. 

## 2021-09-27 NOTE — Progress Notes (Signed)
   PRENATAL VISIT NOTE  Subjective:  Danielle Lowery is a 21 y.o. G1P0000 at [redacted]w[redacted]d being seen today for ongoing prenatal care.  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  Patient reports no complaints.  Contractions: Not present. Vag. Bleeding: None.  Movement: Present. Denies leaking of fluid.   The following portions of the patient's history were reviewed and updated as appropriate: allergies, current medications, past family history, past medical history, past social history, past surgical history and problem list.   Objective:   Vitals:   09/27/21 0858  BP: 132/79  Pulse: (!) 104  Weight: 178 lb 9.6 oz (81 kg)    Fetal Status: Fetal Heart Rate (bpm): 145 Fundal Height: 35 cm Movement: Present  Presentation: Vertex  General:  Alert, oriented and cooperative. Patient is in no acute distress.  Skin: Skin is warm and dry. No rash noted.   Cardiovascular: Normal heart rate noted  Respiratory: Normal respiratory effort, no problems with respiration noted  Abdomen: Soft, gravid, appropriate for gestational age.  Pain/Pressure: Present     Pelvic: Cervical exam deferred        Extremities: Normal range of motion.  Edema: Trace  Mental Status: Normal mood and affect. Normal behavior. Normal judgment and thought content.   Assessment and Plan:  Pregnancy: G1P0000 at [redacted]w[redacted]d 1. Encounter for supervision of normal first pregnancy in third trimester - Frequent and vigorous fetal movement - Culture, beta strep (group b only) - Cervicovaginal ancillary only( [place])  2. [redacted] weeks gestation of pregnancy - Collected today due to delivery by 38 weeks  -Culture, beta strep (group b only) - Cervicovaginal ancillary only( [place])  3. Genital  herpes simplex virus (HSV) infection in mother affecting pregnancy - On suppression, no active outbreaks - Per MFM, schedule C/S delivery at 38 weeks  4. Late prenatal care   Preterm labor symptoms and general obstetric precautions including but not limited to vaginal bleeding, contractions, leaking of fluid and fetal movement were reviewed in detail with the patient. Please refer to After Visit Summary for other counseling recommendations.   No follow-ups on file.  Future Appointments  Date Time Provider [place]  10/09/2021  9:35 AM Constant, Vickii Chafe, MD [place] None  10/16/2021  8:55 AM Constant, Vickii Chafe, MD [place] None  10/23/2021  9:35 AM Leftwich-Kirby, Kathie Dike, CNM [facility] None  10/30/2021  8:55 AM Constant, Vickii Chafe, MD [place] None    Johnston Ebbs, NP

## 2021-09-28 LAB — CERVICOVAGINAL ANCILLARY ONLY
Chlamydia: NEGATIVE
Comment: NEGATIVE
Comment: NORMAL
Neisseria Gonorrhea: NEGATIVE

## 2021-10-01 LAB — CULTURE, BETA STREP (GROUP B ONLY): Strep Gp B Culture: NEGATIVE

## 2021-10-05 ENCOUNTER — Encounter (HOSPITAL_COMMUNITY): Payer: Self-pay

## 2021-10-05 NOTE — Patient Instructions (Signed)
Danielle Lowery  10/05/2021   Your procedure is scheduled on:  10/16/2021  Arrive at 82 at Entrance C on [address] at [facility]  and [address]. You are invited to use the FREE valet parking or use the Visitor's parking deck.  Pick up the phone at the desk and dial [phone].  Call this number if you have problems the morning of surgery: [phone]  Remember:   Do not eat food:(After Midnight) Desps de medianoche.  Do not drink clear liquids: (After Midnight) Desps de medianoche.  Take these medicines the morning of surgery with A SIP OF WATER:  valtrex   Do not wear jewelry, make-up or nail polish.  Do not wear lotions, powders, or perfumes. Do not wear deodorant.  Do not shave 48 hours prior to surgery.  Do not bring valuables to the hospital.  [facility] is not   responsible for any belongings or valuables brought to the hospital.  Contacts, dentures or bridgework may not be worn into surgery.  Leave suitcase in the car. After surgery it may be brought to your room.  For patients admitted to the hospital, checkout time is 11:00 AM the day of              discharge.      Please read over the following fact sheets that you were given:     Preparing for Surgery

## 2021-10-09 ENCOUNTER — Ambulatory Visit (INDEPENDENT_AMBULATORY_CARE_PROVIDER_SITE_OTHER): Payer: Medicaid Other | Admitting: Obstetrics and Gynecology

## 2021-10-09 ENCOUNTER — Encounter: Payer: Self-pay | Admitting: Obstetrics and Gynecology

## 2021-10-09 VITALS — BP 131/78 | HR 93 | Wt 184.0 lb

## 2021-10-09 DIAGNOSIS — O0933 Supervision of pregnancy with insufficient antenatal care, third trimester: Secondary | ICD-10-CM

## 2021-10-09 DIAGNOSIS — O98313 Other infections with a predominantly sexual mode of transmission complicating pregnancy, third trimester: Secondary | ICD-10-CM

## 2021-10-09 DIAGNOSIS — A6009 Herpesviral infection of other urogenital tract: Secondary | ICD-10-CM

## 2021-10-09 DIAGNOSIS — O0930 Supervision of pregnancy with insufficient antenatal care, unspecified trimester: Secondary | ICD-10-CM

## 2021-10-09 DIAGNOSIS — Z3402 Encounter for supervision of normal first pregnancy, second trimester: Secondary | ICD-10-CM

## 2021-10-09 DIAGNOSIS — Z3A37 37 weeks gestation of pregnancy: Secondary | ICD-10-CM

## 2021-10-09 NOTE — Progress Notes (Signed)
Pt is doing well, scheduled for c/s next week.

## 2021-10-09 NOTE — Progress Notes (Signed)
   PRENATAL VISIT NOTE  Subjective:  Danielle Lowery is a 21 y.o. G1P0000 at [redacted]w[redacted]d being seen today for ongoing prenatal care.  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  Patient reports no complaints.  Contractions: Irregular. Vag. Bleeding: None.  Movement: Present. Denies leaking of fluid.   The following portions of the patient's history were reviewed and updated as appropriate: allergies, current medications, past family history, past medical history, past social history, past surgical history and problem list.   Objective:   Vitals:   10/09/21 0936  BP: 131/78  Pulse: 93  Weight: 184 lb (83.5 kg)    Fetal Status: Fetal Heart Rate (bpm): 140 Fundal Height: 37 cm Movement: Present     General:  Alert, oriented and cooperative. Patient is in no acute distress.  Skin: Skin is warm and dry. No rash noted.   Cardiovascular: Normal heart rate noted  Respiratory: Normal respiratory effort, no problems with respiration noted  Abdomen: Soft, gravid, appropriate for gestational age.  Pain/Pressure: Present     Pelvic: Cervical exam deferred        Extremities: Normal range of motion.  Edema: Trace  Mental Status: Normal mood and affect. Normal behavior. Normal judgment and thought content.   Assessment and Plan:  Pregnancy: G1P0000 at [redacted]w[redacted]d 1. Encounter for supervision of normal first pregnancy in second trimester Patient is doing well without complaints  2. Genital herpes simplex virus (HSV) infection in mother affecting pregnancy Continue Valtrex suppression Patient scheduled for primary cesarean section on 10/9 due to recent primary outbreak Answered questions regarding c-section  3. Late prenatal care   Term labor symptoms and general  obstetric precautions including but not limited to vaginal bleeding, contractions, leaking of fluid and fetal movement were reviewed in detail with the patient. Please refer to After Visit Summary for other counseling recommendations.   Return in about 6 weeks (around 11/20/2021) for postpartum.  Future Appointments  Date Time Provider [place]  10/13/2021  9:00 AM [facility] PAT 1 [facility] None    Mora Bellman, MD

## 2021-10-13 ENCOUNTER — Encounter (HOSPITAL_COMMUNITY)
Admission: RE | Admit: 2021-10-13 | Discharge: 2021-10-13 | Disposition: A | Discharge status: Home / Self Care | Payer: Medicaid Other | Source: Ambulatory Visit | Attending: Obstetrics and Gynecology | Admitting: Obstetrics and Gynecology

## 2021-10-13 DIAGNOSIS — O98319 Other infections with a predominantly sexual mode of transmission complicating pregnancy, unspecified trimester: Secondary | ICD-10-CM | POA: Diagnosis not present

## 2021-10-13 DIAGNOSIS — A6009 Herpesviral infection of other urogenital tract: Secondary | ICD-10-CM | POA: Insufficient documentation

## 2021-10-13 DIAGNOSIS — Z01812 Encounter for preprocedural laboratory examination: Secondary | ICD-10-CM | POA: Diagnosis present

## 2021-10-13 DIAGNOSIS — Z3402 Encounter for supervision of normal first pregnancy, second trimester: Secondary | ICD-10-CM

## 2021-10-13 LAB — CBC
HCT: 34.3 % — ABNORMAL LOW (ref 36.0–46.0)
Hemoglobin: 10.7 g/dL — ABNORMAL LOW (ref 12.0–15.0)
MCH: 26.5 pg (ref 26.0–34.0)
MCHC: 31.2 g/dL (ref 30.0–36.0)
MCV: 84.9 fL (ref 80.0–100.0)
Platelets: 283 10*3/uL (ref 150–400)
RBC: 4.04 MIL/uL (ref 3.87–5.11)
RDW: 14.6 % (ref 11.5–15.5)
WBC: 10.2 10*3/uL (ref 4.0–10.5)
nRBC: 0 % (ref 0.0–0.2)

## 2021-10-13 LAB — TYPE AND SCREEN
ABO/RH(D): O POS
Antibody Screen: NEGATIVE

## 2021-10-13 LAB — RPR: RPR Ser Ql: NONREACTIVE

## 2021-10-15 ENCOUNTER — Encounter (HOSPITAL_COMMUNITY): Payer: Self-pay | Admitting: Family Medicine

## 2021-10-16 ENCOUNTER — Ambulatory Visit: Payer: Medicaid Other

## 2021-10-16 ENCOUNTER — Inpatient Hospital Stay (HOSPITAL_COMMUNITY)
Admission: RE | Admit: 2021-10-16 | Discharge: 2021-10-19 | DRG: 788 | Disposition: A | Discharge status: Home / Self Care | Payer: Medicaid Other | Attending: Family Medicine | Admitting: Family Medicine

## 2021-10-16 ENCOUNTER — Inpatient Hospital Stay (HOSPITAL_COMMUNITY): Payer: Medicaid Other | Admitting: Anesthesiology

## 2021-10-16 ENCOUNTER — Other Ambulatory Visit: Payer: Self-pay

## 2021-10-16 ENCOUNTER — Encounter (HOSPITAL_COMMUNITY)
Admission: RE | Disposition: A | Discharge status: Home / Self Care | Payer: Self-pay | Source: Home / Self Care | Attending: Family Medicine

## 2021-10-16 ENCOUNTER — Encounter: Payer: Medicaid Other | Admitting: Obstetrics and Gynecology

## 2021-10-16 ENCOUNTER — Encounter (HOSPITAL_COMMUNITY): Payer: Self-pay | Admitting: Family Medicine

## 2021-10-16 DIAGNOSIS — O99214 Obesity complicating childbirth: Secondary | ICD-10-CM

## 2021-10-16 DIAGNOSIS — Z3A38 38 weeks gestation of pregnancy: Secondary | ICD-10-CM

## 2021-10-16 DIAGNOSIS — A6000 Herpesviral infection of urogenital system, unspecified: Secondary | ICD-10-CM | POA: Diagnosis present

## 2021-10-16 DIAGNOSIS — B009 Herpesviral infection, unspecified: Secondary | ICD-10-CM

## 2021-10-16 DIAGNOSIS — O0930 Supervision of pregnancy with insufficient antenatal care, unspecified trimester: Secondary | ICD-10-CM

## 2021-10-16 DIAGNOSIS — O9852 Other viral diseases complicating childbirth: Secondary | ICD-10-CM

## 2021-10-16 DIAGNOSIS — A6009 Herpesviral infection of other urogenital tract: Secondary | ICD-10-CM

## 2021-10-16 DIAGNOSIS — O2643 Herpes gestationis, third trimester: Secondary | ICD-10-CM | POA: Diagnosis not present

## 2021-10-16 DIAGNOSIS — O9902 Anemia complicating childbirth: Secondary | ICD-10-CM | POA: Diagnosis present

## 2021-10-16 DIAGNOSIS — O9832 Other infections with a predominantly sexual mode of transmission complicating childbirth: Secondary | ICD-10-CM | POA: Diagnosis present

## 2021-10-16 DIAGNOSIS — E669 Obesity, unspecified: Secondary | ICD-10-CM | POA: Diagnosis not present

## 2021-10-16 DIAGNOSIS — Z3402 Encounter for supervision of normal first pregnancy, second trimester: Principal | ICD-10-CM

## 2021-10-16 HISTORY — DX: Herpesviral infection, unspecified: B00.9

## 2021-10-16 SURGERY — Surgical Case
Anesthesia: Spinal | Wound class: Clean Contaminated

## 2021-10-16 MED ORDER — ONDANSETRON HCL 4 MG/2ML IJ SOLN
INTRAMUSCULAR | Status: DC | PRN
  Administered 2021-10-16: 4 mg via INTRAVENOUS

## 2021-10-16 MED ORDER — ACETAMINOPHEN 500 MG PO TABS
1000.0000 mg | ORAL_TABLET | Freq: Four times a day (QID) | ORAL | Status: DC
  Administered 2021-10-16 – 2021-10-19 (×11): 1000 mg via ORAL
  Filled 2021-10-16 (×12): qty 2

## 2021-10-16 MED ORDER — SOD CITRATE-CITRIC ACID 500-334 MG/5ML PO SOLN
30.0000 mL | ORAL | Status: AC
  Administered 2021-10-16: 30 mL via ORAL

## 2021-10-16 MED ORDER — SENNOSIDES-DOCUSATE SODIUM 8.6-50 MG PO TABS
2.0000 | ORAL_TABLET | Freq: Every day | ORAL | Status: DC
  Administered 2021-10-17 – 2021-10-19 (×3): 2 via ORAL
  Filled 2021-10-16 (×3): qty 2

## 2021-10-16 MED ORDER — OXYCODONE HCL 5 MG PO TABS: 5.0000 mg | ORAL_TABLET | Freq: Once | ORAL | Status: DC | PRN

## 2021-10-16 MED ORDER — DIPHENHYDRAMINE HCL 25 MG PO CAPS
25.0000 mg | ORAL_CAPSULE | Freq: Four times a day (QID) | ORAL | Status: DC | PRN
  Administered 2021-10-17: 25 mg via ORAL
  Filled 2021-10-16: qty 1

## 2021-10-16 MED ORDER — MORPHINE SULFATE (PF) 0.5 MG/ML IJ SOLN
INTRAMUSCULAR | Status: DC | PRN
  Administered 2021-10-16: 150 ug via INTRATHECAL

## 2021-10-16 MED ORDER — DIPHENHYDRAMINE HCL 50 MG/ML IJ SOLN: 12.5000 mg | INTRAMUSCULAR | Status: DC | PRN

## 2021-10-16 MED ORDER — PHENYLEPHRINE HCL-NACL 20-0.9 MG/250ML-% IV SOLN
INTRAVENOUS | Status: DC | PRN
  Administered 2021-10-16: 60 ug/min via INTRAVENOUS

## 2021-10-16 MED ORDER — SODIUM CHLORIDE 0.9 % IR SOLN
Status: DC | PRN
  Administered 2021-10-16: 1000 mL

## 2021-10-16 MED ORDER — DEXAMETHASONE SODIUM PHOSPHATE 10 MG/ML IJ SOLN
INTRAMUSCULAR | Status: DC | PRN
  Administered 2021-10-16: 10 mg via INTRAVENOUS

## 2021-10-16 MED ORDER — DIBUCAINE (PERIANAL) 1 % EX OINT: 1.0000 | TOPICAL_OINTMENT | CUTANEOUS | Status: DC | PRN

## 2021-10-16 MED ORDER — KETOROLAC TROMETHAMINE 30 MG/ML IJ SOLN
30.0000 mg | Freq: Once | INTRAMUSCULAR | Status: AC | PRN
  Administered 2021-10-16: 30 mg via INTRAVENOUS

## 2021-10-16 MED ORDER — LACTATED RINGERS IV SOLN: INTRAVENOUS | Status: DC

## 2021-10-16 MED ORDER — CEFAZOLIN SODIUM-DEXTROSE 2-4 GM/100ML-% IV SOLN
INTRAVENOUS | Status: AC
  Filled 2021-10-16: qty 100

## 2021-10-16 MED ORDER — IBUPROFEN 600 MG PO TABS
600.0000 mg | ORAL_TABLET | Freq: Four times a day (QID) | ORAL | Status: DC
  Administered 2021-10-17 – 2021-10-19 (×8): 600 mg via ORAL
  Filled 2021-10-16 (×8): qty 1

## 2021-10-16 MED ORDER — PROMETHAZINE HCL 25 MG/ML IJ SOLN
6.2500 mg | INTRAMUSCULAR | Status: DC | PRN
  Administered 2021-10-16: 6.25 mg via INTRAVENOUS

## 2021-10-16 MED ORDER — KETOROLAC TROMETHAMINE 30 MG/ML IJ SOLN
30.0000 mg | Freq: Four times a day (QID) | INTRAMUSCULAR | Status: AC
  Administered 2021-10-16 – 2021-10-17 (×4): 30 mg via INTRAVENOUS
  Filled 2021-10-16 (×4): qty 1

## 2021-10-16 MED ORDER — PROMETHAZINE HCL 25 MG/ML IJ SOLN
INTRAMUSCULAR | Status: AC
  Filled 2021-10-16: qty 1

## 2021-10-16 MED ORDER — SOD CITRATE-CITRIC ACID 500-334 MG/5ML PO SOLN
ORAL | Status: AC
  Filled 2021-10-16: qty 30

## 2021-10-16 MED ORDER — SODIUM CHLORIDE 0.9% FLUSH: 3.0000 mL | INTRAVENOUS | Status: DC | PRN

## 2021-10-16 MED ORDER — NALOXONE HCL 0.4 MG/ML IJ SOLN: 0.4000 mg | INTRAMUSCULAR | Status: DC | PRN

## 2021-10-16 MED ORDER — WITCH HAZEL-GLYCERIN EX PADS: 1.0000 | MEDICATED_PAD | CUTANEOUS | Status: DC | PRN

## 2021-10-16 MED ORDER — STERILE WATER FOR IRRIGATION IR SOLN
Status: DC | PRN
  Administered 2021-10-16: 1000 mL

## 2021-10-16 MED ORDER — MEPERIDINE HCL 25 MG/ML IJ SOLN: 6.2500 mg | INTRAMUSCULAR | Status: DC | PRN

## 2021-10-16 MED ORDER — SIMETHICONE 80 MG PO CHEW: 80.0000 mg | CHEWABLE_TABLET | ORAL | Status: DC | PRN

## 2021-10-16 MED ORDER — OXYTOCIN-SODIUM CHLORIDE 30-0.9 UT/500ML-% IV SOLN
2.5000 [IU]/h | INTRAVENOUS | Status: AC
  Administered 2021-10-16: 2.5 [IU]/h via INTRAVENOUS
  Filled 2021-10-16: qty 500

## 2021-10-16 MED ORDER — ZOLPIDEM TARTRATE 5 MG PO TABS: 5.0000 mg | ORAL_TABLET | Freq: Every evening | ORAL | Status: DC | PRN

## 2021-10-16 MED ORDER — HYDROMORPHONE HCL 1 MG/ML IJ SOLN: 0.2500 mg | INTRAMUSCULAR | Status: DC | PRN

## 2021-10-16 MED ORDER — POVIDONE-IODINE 10 % EX SWAB
2.0000 | Freq: Once | CUTANEOUS | Status: AC
  Administered 2021-10-16: 2 via TOPICAL

## 2021-10-16 MED ORDER — OXYCODONE HCL 5 MG/5ML PO SOLN: 5.0000 mg | Freq: Once | ORAL | Status: DC | PRN

## 2021-10-16 MED ORDER — KETOROLAC TROMETHAMINE 30 MG/ML IJ SOLN
INTRAMUSCULAR | Status: AC
  Filled 2021-10-16: qty 1

## 2021-10-16 MED ORDER — CEFAZOLIN SODIUM-DEXTROSE 2-4 GM/100ML-% IV SOLN
2.0000 g | INTRAVENOUS | Status: AC
  Administered 2021-10-16: 2 g via INTRAVENOUS

## 2021-10-16 MED ORDER — NALOXONE HCL 4 MG/10ML IJ SOLN: 1.0000 ug/kg/h | INTRAVENOUS | Status: DC | PRN

## 2021-10-16 MED ORDER — FENTANYL CITRATE (PF) 100 MCG/2ML IJ SOLN
INTRAMUSCULAR | Status: DC | PRN
  Administered 2021-10-16: 15 ug via INTRATHECAL

## 2021-10-16 MED ORDER — FENTANYL CITRATE (PF) 100 MCG/2ML IJ SOLN
INTRAMUSCULAR | Status: AC
  Filled 2021-10-16: qty 2

## 2021-10-16 MED ORDER — COCONUT OIL OIL: 1.0000 | TOPICAL_OIL | Status: DC | PRN

## 2021-10-16 MED ORDER — OXYTOCIN-SODIUM CHLORIDE 30-0.9 UT/500ML-% IV SOLN
INTRAVENOUS | Status: DC | PRN
  Administered 2021-10-16 (×2): 30 [IU] via INTRAVENOUS

## 2021-10-16 MED ORDER — MORPHINE SULFATE (PF) 0.5 MG/ML IJ SOLN
INTRAMUSCULAR | Status: AC
  Filled 2021-10-16: qty 10

## 2021-10-16 MED ORDER — OXYCODONE HCL 5 MG PO TABS
5.0000 mg | ORAL_TABLET | ORAL | Status: DC | PRN
  Administered 2021-10-17: 5 mg via ORAL
  Filled 2021-10-16: qty 1

## 2021-10-16 MED ORDER — DIPHENHYDRAMINE HCL 25 MG PO CAPS: 25.0000 mg | ORAL_CAPSULE | ORAL | Status: DC | PRN

## 2021-10-16 MED ORDER — ENOXAPARIN SODIUM 40 MG/0.4ML IJ SOSY
40.0000 mg | PREFILLED_SYRINGE | INTRAMUSCULAR | Status: DC
  Administered 2021-10-17 – 2021-10-19 (×3): 40 mg via SUBCUTANEOUS
  Filled 2021-10-16 (×3): qty 0.4

## 2021-10-16 MED ORDER — MAGNESIUM HYDROXIDE 400 MG/5ML PO SUSP: 30.0000 mL | ORAL | Status: DC | PRN

## 2021-10-16 MED ORDER — MENTHOL 3 MG MT LOZG: 1.0000 | LOZENGE | OROMUCOSAL | Status: DC | PRN

## 2021-10-16 MED ORDER — PHENYLEPHRINE 80 MCG/ML (10ML) SYRINGE FOR IV PUSH (FOR BLOOD PRESSURE SUPPORT)
PREFILLED_SYRINGE | INTRAVENOUS | Status: DC | PRN
  Administered 2021-10-16: 80 ug via INTRAVENOUS
  Administered 2021-10-16: 240 ug via INTRAVENOUS

## 2021-10-16 MED ORDER — SIMETHICONE 80 MG PO CHEW
80.0000 mg | CHEWABLE_TABLET | Freq: Three times a day (TID) | ORAL | Status: DC
  Administered 2021-10-16 – 2021-10-19 (×7): 80 mg via ORAL
  Filled 2021-10-16 (×7): qty 1

## 2021-10-16 MED ORDER — GABAPENTIN 100 MG PO CAPS
200.0000 mg | ORAL_CAPSULE | Freq: Every day | ORAL | Status: DC
  Administered 2021-10-16 – 2021-10-18 (×3): 200 mg via ORAL
  Filled 2021-10-16 (×3): qty 2

## 2021-10-16 MED ORDER — PRENATAL MULTIVITAMIN CH
1.0000 | ORAL_TABLET | Freq: Every day | ORAL | Status: DC
  Administered 2021-10-17 – 2021-10-19 (×3): 1 via ORAL
  Filled 2021-10-16 (×3): qty 1

## 2021-10-16 MED ORDER — OXYTOCIN-SODIUM CHLORIDE 30-0.9 UT/500ML-% IV SOLN
INTRAVENOUS | Status: AC
  Filled 2021-10-16: qty 500

## 2021-10-16 MED ORDER — BUPIVACAINE IN DEXTROSE 0.75-8.25 % IT SOLN
INTRATHECAL | Status: DC | PRN
  Administered 2021-10-16: 1.4 mL via INTRATHECAL

## 2021-10-16 SURGICAL SUPPLY — 38 items
APL SKNCLS STERI-STRIP NONHPOA (GAUZE/BANDAGES/DRESSINGS) ×1
BENZOIN TINCTURE PRP APPL 2/3 (GAUZE/BANDAGES/DRESSINGS) IMPLANT
CHLORAPREP W/TINT 26ML (MISCELLANEOUS) ×4 IMPLANT
CLAMP UMBILICAL CORD (MISCELLANEOUS) ×2 IMPLANT
CLOTH BEACON ORANGE TIMEOUT ST (SAFETY) ×2 IMPLANT
DRSG OPSITE POSTOP 4X10 (GAUZE/BANDAGES/DRESSINGS) ×2 IMPLANT
ELECT REM PT RETURN 9FT ADLT (ELECTROSURGICAL) ×1
ELECTRODE REM PT RTRN 9FT ADLT (ELECTROSURGICAL) ×2 IMPLANT
EXTRACTOR VACUUM BELL CUP MITY (SUCTIONS) IMPLANT
EXTRACTOR VACUUM BELL STYLE (SUCTIONS) IMPLANT
GAUZE SPONGE 4X4 12PLY STRL LF (GAUZE/BANDAGES/DRESSINGS) IMPLANT
GLOVE BIOGEL PI IND STRL 7.0 (GLOVE) ×4 IMPLANT
GLOVE ECLIPSE 7.0 STRL STRAW (GLOVE) ×2 IMPLANT
GOWN STRL REUS W/TWL LRG LVL3 (GOWN DISPOSABLE) ×4 IMPLANT
KIT ABG SYR 3ML LUER SLIP (SYRINGE) ×2 IMPLANT
MAT PREVALON FULL STRYKER (MISCELLANEOUS) IMPLANT
NDL HYPO 25X5/8 SAFETYGLIDE (NEEDLE) ×2 IMPLANT
NEEDLE HYPO 25X5/8 SAFETYGLIDE (NEEDLE) ×1 IMPLANT
NS IRRIG 1000ML POUR BTL (IV SOLUTION) ×2 IMPLANT
PACK C SECTION WH (CUSTOM PROCEDURE TRAY) ×2 IMPLANT
PAD ABD 7.5X8 STRL (GAUZE/BANDAGES/DRESSINGS) IMPLANT
PAD OB MATERNITY 4.3X12.25 (PERSONAL CARE ITEMS) ×2 IMPLANT
RTRCTR C-SECT PINK 25CM LRG (MISCELLANEOUS) ×2 IMPLANT
STRIP CLOSURE SKIN 1/2X4 (GAUZE/BANDAGES/DRESSINGS) IMPLANT
SUT MNCRL 0 VIOLET CTX 36 (SUTURE) ×4 IMPLANT
SUT MONOCRYL 0 CTX 36 (SUTURE) ×2
SUT PLAIN 0 NONE (SUTURE) IMPLANT
SUT PLAIN 2 0 (SUTURE)
SUT PLAIN 2 0 XLH (SUTURE) IMPLANT
SUT PLAIN ABS 2-0 CT1 27XMFL (SUTURE) IMPLANT
SUT VIC AB 0 CTX 36 (SUTURE) ×1
SUT VIC AB 0 CTX36XBRD ANBCTRL (SUTURE) ×2 IMPLANT
SUT VIC AB 2-0 CT1 27 (SUTURE) ×1
SUT VIC AB 2-0 CT1 TAPERPNT 27 (SUTURE) IMPLANT
SUT VIC AB 4-0 KS 27 (SUTURE) ×2 IMPLANT
TOWEL OR 17X24 6PK STRL BLUE (TOWEL DISPOSABLE) ×2 IMPLANT
TRAY FOLEY W/BAG SLVR 14FR LF (SET/KITS/TRAYS/PACK) IMPLANT
WATER STERILE IRR 1000ML POUR (IV SOLUTION) ×2 IMPLANT

## 2021-10-16 NOTE — Op Note (Signed)
Danielle Lowery PROCEDURE DATE: 10/16/2021  PREOPERATIVE DIAGNOSES: Intrauterine pregnancy at [redacted]w[redacted]d weeks gestation; herpes virus infection  POSTOPERATIVE DIAGNOSES: The same, viable infant delivered  PROCEDURE: PrimaryLow Transverse Cesarean Section  SURGEON:  Dr. Clayton Lefort  ASSISTANT:  Shelda Pal, DO An experienced assistant was required given the standard of surgical care given the complexity of the case.  This assistant was needed for exposure, dissection, suctioning, retraction, instrument exchange, assisting with delivery with administration of fundal pressure, and for overall help during the procedure.  ANESTHESIOLOGY TEAM: Anesthesiologist: Lynda Rainwater, MD CRNA: Thelma Comp, CRNA Student Nurse Anesthetist: Carin Hock, RN  INDICATIONS: Danielle Lowery is a 21 y.o. G1P1001 at [redacted]w[redacted]d here for cesarean section secondary to the indications listed under preoperative diagnoses; please see preoperative note for further details.  The risks of surgery were discussed with the patient including but were not limited to: bleeding which may require transfusion or reoperation; infection which may require antibiotics; injury to bowel, bladder, ureters or other surrounding organs; injury to the fetus; need for additional procedures including hysterectomy in the event of a life-threatening hemorrhage; formation of adhesions; placental abnormalities wth subsequent pregnancies; incisional problems; thromboembolic phenomenon and other postoperative/anesthesia complications.  The patient concurred with the proposed plan, giving informed written consent for the procedure.    FINDINGS:  Viable female infant in cephalic presentation.  Apgars 8 and 9.  Amniotic fluid: clear.  Intact placenta, three vessel cord.  Normal uterus, fallopian tubes and ovaries bilaterally.  ANESTHESIA: spinal INTRAVENOUS FLUIDS: 1000 ml   ESTIMATED BLOOD LOSS: 400 ml URINE OUTPUT:  150  ml SPECIMENS: Placenta sent to L&D . COMPLICATIONS: None immediate  PROCEDURE IN DETAIL:  The patient preoperatively received intravenous antibiotics and had sequential compression devices applied to her lower extremities.  She was then taken to the operating room where spinal anesthesia was found to be adequate. She was then placed in a dorsal supine position with a leftward tilt, and prepped and draped in a sterile manner.  A foley catheter was  placed into her bladder and attached to constant gravity.  After an adequate timeout was performed, a Pfannenstiel skin incision was made with scalpel and carried through to the underlying layer of fascia. The fascia was incised in the midline, and this incision was extended bluntly. The rectus muscles were separated in the midline and the peritoneum was entered bluntly.   The Alexis self-retaining retractor was introduced into the abdominal cavity.  Attention was turned to the lower uterine segment where a low transverse hysterotomy was made with a scalpel and extended bluntly in caudad and cephalad directions.  The infant was successfully delivered, the cord was clamped and cut after one minute, and the infant was handed over to the awaiting neonatology team. Uterine massage was then administered, and the placenta delivered intact with a three-vessel cord. The uterus was then cleared of clots and debris.  The hysterotomy was closed with 0-Monocryl in a running fashion.   The pelvis was cleared of all clot and debris. Hemostasis was confirmed on all surfaces.  The uterus was once again inspected and found to be hemostatic. The retractor was removed.  The peritoneum was closed with a 2-0 Vicryl running stitch. The fascia was then closed using 0 Vicryl in a running fashion.  The subcutaneous layer was irrigated, any areas of bleeding were cauterized with the bovie,  was reapproximated with 2-0 plain gut in a running fashion, was found to be hemostatic. The skin  was  closed with a 4-0 Vicryl subcuticular stitch. The patient tolerated the procedure well. Sponge, instrument and needle counts were correct x 3.  She was taken to the recovery room in stable condition.   Myrtie Hawk, DO FMOB Fellow, Faculty practice [facility], Center for [facility] 10/16/21  10:49 AM

## 2021-10-16 NOTE — Discharge Summary (Addendum)
Postpartum Discharge Summary  Date of Service updated 10/18/21     Patient Name: Danielle Lowery DOB: October 24, 2000 MRN: [id number]  Date of admission: 10/16/2021 Delivery date:10/16/2021  Delivering provider: Clarnce Flock  Date of discharge: 10/18/2021  Admitting diagnosis: HSV-2 infection complicating pregnancy [B90.383, B00.9] Delivery of pregnancy by cesarean section [O82] Intrauterine pregnancy: [redacted]w[redacted]d    Secondary diagnosis:  Principal Problem:   Delivery by cesarean section of full-term infant Active Problems:   Encounter for supervision of normal first pregnancy in second trimester   Late prenatal care   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 yo G1P1001 at 312w0das admitted to the hospital 10/16/2021 for scheduled cesarean section with the following indication:Active HSV.Delivery details are as follows:  Membrane Rupture Time/Date: 10:09 AM ,10/16/2021   Delivery Method:C-Section, Vacuum Assisted  Details of operation can be found in separate operative note.  Patient had a postpartum course complicated byNA.  She is ambulating, tolerating a regular diet, passing flatus, and urinating well. Patient is discharged home in stable condition on  10/18/21        Newborn Data: Birth date:10/16/2021  Birth time:10:11 AM  Gender:Female  Living status:Living  Apgars:8 ,9  Weight:3200 g     Magnesium Sulfate received: No BMZ received: No Rhophylac:N/A MMR:N/A T-DaP:Given prenatally Flu: N/A Transfusion:No   Physical exam  Vitals:   10/17/21 0556 10/17/21 1535 10/17/21 2100 10/18/21 0523  BP: 107/65 126/82 120/68 119/71   Pulse: 77 91 84 84  Resp: 17 18 17 17   Temp: 97.8 F (36.6 C) 98.5 F (36.9 C) 98.4 F (36.9 C) 98.2 F (36.8 C)  TempSrc: Oral Oral Oral Oral  SpO2: 98%  100% 99%   General: alert Lochia: appropriate Uterine Fundus: firm Incision: Healing well with no significant drainage DVT Evaluation: No evidence of DVT seen on physical exam. Labs: Lab Results  Component Value Date   WBC 12.2 (H) 10/17/2021   HGB 8.6 (L) 10/17/2021   HCT 25.2 (L) 10/17/2021   MCV 81.3 10/17/2021   PLT 250 10/17/2021      Latest Ref Rng & Units 07/20/2017    5:39 AM  CMP  Glucose 70 - 99 mg/dL 110   BUN 6 - 20 mg/dL 12   Creatinine 0.44 - 1.00 mg/dL 0.66   Sodium 135 - 145 mmol/L 141   Potassium 3.5 - 5.1 mmol/L 4.1   Chloride 98 - 111 mmol/L 105   CO2 22 - 32 mmol/L 25   Calcium 8.9 - 10.3 mg/dL 9.5   Total Protein 6.5 - 8.1 g/dL 7.5   Total Bilirubin 0.3 - 1.2 mg/dL 0.7   Alkaline Phos 38 - 126 U/L 65   AST 15 - 41 U/L 19   ALT 0 - 44 U/L 15    Edinburgh Score:    10/17/2021    8:15 AM  Edinburgh Postnatal Depression Scale Screening Tool  I have been able to laugh and see the funny side  of things. 0  I have looked forward with enjoyment to things. 0  I have blamed myself unnecessarily when things went wrong. 1  I have been anxious or worried for no good reason. 1  I have felt scared or panicky for no good reason. 0  Things have been getting on top of me. 1  I have been so unhappy that I have had difficulty sleeping. 1  I have felt sad or miserable. 1  I have been so unhappy that I have been crying. 0  The thought of harming myself has occurred to me. 0  Edinburgh Postnatal Depression Scale Total 5     After visit meds:  Allergies as of 10/18/2021   No Known Allergies      Medication List     STOP taking these medications    Blood Pressure Kit Devi       TAKE these medications    acetaminophen 500 MG tablet Commonly known as: TYLENOL Take 2 tablets (1,000 mg total)  by mouth every 6 (six) hours.   ferrous sulfate 325 (65 FE) MG tablet Take 1 tablet (325 mg total) by mouth daily with breakfast.   ibuprofen 600 MG tablet Commonly known as: ADVIL Take 1 tablet (600 mg total) by mouth every 6 (six) hours.   oxyCODONE 5 MG immediate release tablet Commonly known as: Oxy IR/ROXICODONE Take 1-2 tablets (5-10 mg total) by mouth every 4 (four) hours as needed for moderate pain.   Prenatal Gummies 0.18-25 MG Chew Chew 1 tablet by mouth daily.   valACYclovir 500 MG tablet Commonly known as: VALTREX Take 1 tablet (500 mg total) by mouth 2 (two) times daily.         Discharge home in stable condition Infant Feeding: Bottle and Breast Infant Disposition:home with mother Discharge instruction: per After Visit Summary and Postpartum booklet. Activity: Advance as tolerated. Pelvic rest for 6 weeks.  Diet: routine diet Future Appointments: Future Appointments  Date Time Provider Julian  10/23/2021 11:00 AM Charles None  11/27/2021 10:55 AM Griffin Basil, MD [facility] None   Follow up Visit:  Message sent to [facility] on 10/9  Please schedule this patient for a In person postpartum visit in 6 weeks with the following provider: Any provider. Additional Postpartum F/U:Incision check 1 week  Low risk pregnancy complicated by:  n/a Delivery mode:  C-Section, Vacuum Assisted  Anticipated Birth Control:  Condoms   10/18/2021 Deloria Lair, DO  GME ATTESTATION:  I saw and evaluated the patient. I agree with the findings and the plan of care as documented in the resident's note. I have made changes to documentation as necessary.  Gerlene Fee, DO OB Fellow, [place] for [place] 10/18/2021, 9:18 AM

## 2021-10-16 NOTE — H&P (Signed)
Obstetric Preoperative History and Physical  Danielle Lowery is a 21 y.o. G1P0000 with IUP at 73w0dpresenting for scheduled cesarean section.  Reports good fetal movement, no bleeding, no contractions, no leaking of fluid.  No acute preoperative concerns.    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   Prenatal labs and studies: ABO, Rh: --/--/PENDING (10/09 0804) Antibody: NEG (10/06 0906) Rubella: 7.49 (06/07 1152) RPR: NON REACTIVE (10/06 0906)  HBsAg: Negative (06/07 1152)  HIV: Non Reactive (08/03 0923)  GIPJ:ASNKNLZJ/- (09/20 0936) 2 hr Glucola  nml Genetic screening normal Anatomy UKoreanormal  Prenatal Transfer Tool  Maternal Diabetes: No Genetic Screening: Normal Maternal Ultrasounds/Referrals: Normal Fetal Ultrasounds or other Referrals:  None Maternal Substance Abuse:  No Significant Maternal Medications:  None Significant Maternal Lab Results: Group B Strep negative  Past Medical History:  Diagnosis Date   Herpes simplex virus (HSV) infection     Past Surgical History:  Procedure Laterality Date   DENTAL SURGERY  2015    OB History  Gravida Para Term Preterm AB Living  1 0 0 0 0 0  SAB IAB Ectopic Multiple Live Births  0 0 0 0 0    # Outcome Date GA Lbr Len/2nd Weight Sex Delivery Anes PTL Lv  1 Current             Social History   Socioeconomic History   Marital status: Single    Spouse name: Not on file   Number of children: Not on file    Years of education: Not on file   Highest education level: Not on file  Occupational History   Not on file  Tobacco Use   Smoking status: Never   Smokeless tobacco: Never  Vaping Use   Vaping Use: Never used  Substance and Sexual Activity   Alcohol use: No   Drug use: No   Sexual activity: Yes    Partners: Male  Other Topics Concern   Not on file  Social History Narrative   Not on file   Social Determinants of Health   Financial Resource Strain: Not on file  Food Insecurity: No Food Insecurity (10/16/2021)   Hunger Vital Sign    Worried About Running Out of Food in the Last Year: Never true    Ran Out of Food in the Last Year: Never true  Transportation Needs: No Transportation Needs (10/16/2021)   PRAPARE - THydrologist(Medical): No    Lack of Transportation (Non-Medical): No  Physical Activity: Not on file  Stress: Not on file  Social Connections: Not on file    Family History  Problem Relation Age of Onset   Diabetes Mother    Diabetes Father    Diabetes Maternal Grandmother    Diabetes Maternal Grandfather    Diabetes Paternal Grandmother    Cancer Paternal Grandfather     Medications Prior to Admission  Medication Sig Dispense Refill Last Dose   Prenatal MV & Min w/FA-DHA (PRENATAL GUMMIES) 0.18-25  MG CHEW Chew 1 tablet by mouth daily. 30 tablet 11    valACYclovir (VALTREX) 500 MG tablet Take 1 tablet (500 mg total) by mouth 2 (two) times daily. 60 tablet 6    Blood Pressure Monitoring (BLOOD PRESSURE KIT) DEVI 1 Device by Does not apply route once a week. (Patient not taking: Reported on 09/27/2021) 1 each 0     No Known Allergies  Review of Systems: Pertinent items noted in HPI and remainder of comprehensive ROS otherwise negative.  Physical Exam: BP 132/86 (BP Location: Right Arm)   Pulse 98   Temp 98.4 F (36.9 C) (Oral)   Resp 18   LMP 01/23/2021   SpO2 99%  FHR by Doppler: 130 bpm CONSTITUTIONAL: Well-developed,  well-nourished female in no acute distress.  HENT:  Normocephalic, atraumatic, External right and left ear normal. Oropharynx is clear and moist EYES: Conjunctivae and EOM are normal. Pupils are equal, round, and reactive to light. No scleral icterus.  NECK: Normal range of motion, supple, no masses SKIN: Skin is warm and dry. No rash noted. Not diaphoretic. No erythema. No pallor. NEUROLOGIC: Alert and oriented to person, place, and time. Normal reflexes, muscle tone coordination. No cranial nerve deficit noted. PSYCHIATRIC: Normal mood and affect. Normal behavior. Normal judgment and thought content. CARDIOVASCULAR: Normal heart rate noted, regular rhythm RESPIRATORY: Effort and breath sounds normal, no problems with respiration noted ABDOMEN: Soft, nontender, nondistended, gravid. No lesions, scars. PELVIC: Deferred MUSCULOSKELETAL: Normal range of motion. No edema and no tenderness. 2+ distal pulses.  Pertinent Labs/Studies:   Results for orders placed or performed during the hospital encounter of 10/16/21 (from the past 72 hour(s))  ABO/Rh     Status: None (Preliminary result)   Collection Time: 10/16/21  8:04 AM  Result Value Ref Range   ABO/RH(D) PENDING     Assessment and Plan: Danielle Lowery is a 21 y.o. G1P0000 at 27w0dbeing admitted for scheduled cesarean section. The risks of surgery were discussed with the patient including but were not limited to: bleeding which may require transfusion or reoperation; infection which may require antibiotics; injury to bowel, bladder, ureters or other surrounding organs; injury to the fetus; need for additional procedures including hysterectomy in the event of a life-threatening hemorrhage; formation of adhesions; placental abnormalities wth subsequent pregnancies; incisional problems; thromboembolic phenomenon and other postoperative/anesthesia complications. The patient concurred with the proposed plan, giving informed written consent for the  procedure. Patient has been NPO since last night she will remain NPO for procedure. Anesthesia and OR aware. Preoperative prophylactic antibiotics and SCDs ordered on call to the OR. To OR when ready.    JShelda Pal DDeweyFellow, Faculty practice CDat[place]for [facility] 10/16/21  8:29 AM

## 2021-10-16 NOTE — Anesthesia Preprocedure Evaluation (Signed)
Anesthesia Evaluation  Patient identified by MRN, date of birth, ID band Patient awake    Reviewed: Allergy & Precautions, NPO status , Patient's Chart, lab work & pertinent test results  Airway Mallampati: II  TM Distance: >3 FB Neck ROM: Full    Dental no notable dental hx.    Pulmonary neg pulmonary ROS,    Pulmonary exam normal breath sounds clear to auscultation       Cardiovascular negative cardio ROS Normal cardiovascular exam Rhythm:Regular Rate:Normal     Neuro/Psych negative neurological ROS  negative psych ROS   GI/Hepatic negative GI ROS, Neg liver ROS,   Endo/Other  negative endocrine ROS  Renal/GU negative Renal ROS  negative genitourinary   Musculoskeletal negative musculoskeletal ROS (+)   Abdominal (+) + obese,   Peds negative pediatric ROS (+)  Hematology negative hematology ROS (+)   Anesthesia Other Findings   Reproductive/Obstetrics (+) Pregnancy                             Anesthesia Physical Anesthesia Plan  ASA: 2  Anesthesia Plan: Spinal   Post-op Pain Management:    Induction: Intravenous  PONV Risk Score and Plan: Treatment may vary due to age or medical condition  Airway Management Planned: Natural Airway  Additional Equipment:   Intra-op Plan:   Post-operative Plan:   Informed Consent: I have reviewed the patients History and Physical, chart, labs and discussed the procedure including the risks, benefits and alternatives for the proposed anesthesia with the patient or authorized representative who has indicated his/her understanding and acceptance.     Dental advisory given  Plan Discussed with: CRNA  Anesthesia Plan Comments:         Anesthesia Quick Evaluation

## 2021-10-16 NOTE — Transfer of Care (Signed)
Immediate Anesthesia Transfer of Care Note  Patient: Danielle Lowery  Procedure(s) Performed: CESAREAN SECTION  Patient Location: PACU  Anesthesia Type:Spinal  Level of Consciousness: awake, alert  and patient cooperative  Airway & Oxygen Therapy: Patient Spontanous Breathing  Post-op Assessment: Report given to RN and Post -op Vital signs reviewed and stable  Post vital signs: Reviewed and stable  Last Vitals:  Vitals Value Taken Time  BP 125/79   Temp    Pulse 73   Resp 15   SpO2 100     Last Pain:  Vitals:   10/16/21 0807  TempSrc: Oral         Complications: No notable events documented.

## 2021-10-16 NOTE — Anesthesia Procedure Notes (Signed)
Spinal  Patient location during procedure: OB Start time: 10/16/2021 9:39 AM End time: 10/16/2021 9:44 AM Reason for block: surgical anesthesia Staffing Anesthesiologist: Lynda Rainwater, MD Performed by: Lynda Rainwater, MD Authorized by: Lynda Rainwater, MD   Preanesthetic Checklist Completed: patient identified, IV checked, risks and benefits discussed, surgical consent, monitors and equipment checked, pre-op evaluation and timeout performed Spinal Block Patient position: sitting Prep: DuraPrep and site prepped and draped Patient monitoring: heart rate, cardiac monitor, continuous pulse ox and blood pressure Approach: midline Location: L3-4 Injection technique: single-shot Needle Needle type: Sprotte  Needle gauge: 24 G Needle length: 10 cm Assessment Sensory level: T4 Events: CSF return Additional Notes SAB placed by SRNA under direct supervision

## 2021-10-16 NOTE — Anesthesia Postprocedure Evaluation (Signed)
Anesthesia Post Note  Patient: Danielle Lowery  Procedure(s) Performed: [place]     Patient location during evaluation: PACU Anesthesia Type: Spinal Level of consciousness: awake and alert Pain management: pain level controlled Vital Signs Assessment: post-procedure vital signs reviewed and stable Respiratory status: spontaneous breathing, nonlabored ventilation and respiratory function stable Cardiovascular status: blood pressure returned to baseline and stable Postop Assessment: no apparent nausea or vomiting Anesthetic complications: no   No notable events documented.  Last Vitals:  Vitals:   10/16/21 1201 10/16/21 1210  BP: 118/72 113/76  Pulse: (!) 52 (!) 53  Resp: 18 18  Temp:  36.6 C  SpO2: 100% 98%    Last Pain:  Vitals:   10/16/21 1210  TempSrc: Oral  PainSc: 0-No pain   Pain Goal:    LLE Motor Response: No movement due to regional block (10/16/21 1201) LLE Sensation: Decreased (10/16/21 1201) RLE Motor Response: No movement due to regional block (10/16/21 1201) RLE Sensation: Decreased (10/16/21 1201)     Epidural/Spinal Function Cutaneous sensation: Tingles (10/16/21 1210), Patient able to flex knees: Yes (10/16/21 1210), Patient able to lift hips off bed: Yes (10/16/21 1210), Back pain beyond tenderness at insertion site: No (10/16/21 1210), Progressively worsening motor and/or sensory loss: No (10/16/21 1210), Bowel and/or bladder incontinence post epidural: No (10/16/21 1210)  Lynda Rainwater

## 2021-10-17 LAB — CBC
HCT: 25.2 % — ABNORMAL LOW (ref 36.0–46.0)
Hemoglobin: 8.6 g/dL — ABNORMAL LOW (ref 12.0–15.0)
MCH: 27.7 pg (ref 26.0–34.0)
MCHC: 34.1 g/dL (ref 30.0–36.0)
MCV: 81.3 fL (ref 80.0–100.0)
Platelets: 250 10*3/uL (ref 150–400)
RBC: 3.1 MIL/uL — ABNORMAL LOW (ref 3.87–5.11)
RDW: 14.9 % (ref 11.5–15.5)
WBC: 12.2 10*3/uL — ABNORMAL HIGH (ref 4.0–10.5)
nRBC: 0 % (ref 0.0–0.2)

## 2021-10-17 LAB — ABO/RH: ABO/RH(D): O POS

## 2021-10-17 MED ORDER — FERROUS SULFATE 325 (65 FE) MG PO TABS
325.0000 mg | ORAL_TABLET | Freq: Every day | ORAL | Status: DC
  Administered 2021-10-18 – 2021-10-19 (×2): 325 mg via ORAL
  Filled 2021-10-17 (×2): qty 1

## 2021-10-17 NOTE — Progress Notes (Signed)
POSTPARTUM PROGRESS NOTE  POD #1  Subjective:  Danielle Lowery is a 21 y.o. G1P1001 s/p pLTCS at [redacted]w[redacted]d.  She reports she doing well. No acute events overnight. She reports she is doing well. She denies any problems with ambulating, voiding or po intake. Denies nausea or vomiting. She has passed flatus. Pain is well controlled.  Lochia is minimal.  Objective: Blood pressure 107/65, pulse 77, temperature 97.8 F (36.6 C), temperature source Oral, resp. rate 17, last menstrual period 01/23/2021, SpO2 98 %, unknown if currently breastfeeding.  Physical Exam:  General: alert, cooperative and no distress Chest: no respiratory distress Heart:regular rate, distal pulses intact Abdomen: soft, nontender,  Uterine Fundus: firm, appropriately tender DVT Evaluation: No calf swelling or tenderness Extremities: No LE edema Skin: warm, dry; incision clean/dry/intact w/ pressure dressing in place  Recent Labs    10/17/21 0544  HGB 8.6*  HCT 25.2*    Assessment/Plan: Danielle Lowery is a 21 y.o. G1P1001 s/p pLTCS at [redacted]w[redacted]d for HSV, third trimester outbreak.  POD#1 - Doing welll; pain well controlled. H/H appropriate  Routine postpartum care  OOB, ambulated  Lovenox for VTE prophylaxis Anemia: asymptomatic  Start po ferrous sulfate Contraception: Condoms Feeding: Bottle  Dispo: Plan for discharge 10/11-10/12.   LOS: 1 day   Gerlene Fee, DO OB Fellow, [place] for [place] 10/17/2021, 12:30 PM

## 2021-10-18 MED ORDER — ACETAMINOPHEN 500 MG PO TABS: 1000.0000 mg | ORAL_TABLET | Freq: Four times a day (QID) | ORAL | 0 refills | Status: DC

## 2021-10-18 MED ORDER — IBUPROFEN 600 MG PO TABS: 600.0000 mg | ORAL_TABLET | Freq: Four times a day (QID) | ORAL | 0 refills | Status: DC

## 2021-10-18 MED ORDER — FERROUS SULFATE 325 (65 FE) MG PO TABS: 325.0000 mg | ORAL_TABLET | Freq: Every day | ORAL | 3 refills | Status: DC

## 2021-10-18 MED ORDER — OXYCODONE HCL 5 MG PO TABS: 5.0000 mg | ORAL_TABLET | ORAL | 0 refills | Status: DC | PRN

## 2021-10-19 ENCOUNTER — Other Ambulatory Visit (HOSPITAL_COMMUNITY): Payer: Self-pay

## 2021-10-19 MED ORDER — SENNOSIDES-DOCUSATE SODIUM 8.6-50 MG PO TABS
2.0000 | ORAL_TABLET | Freq: Every day | ORAL | 0 refills | Status: DC
  Filled 2021-10-19: qty 30, 15d supply, fill #0

## 2021-10-19 MED ORDER — OXYCODONE HCL 5 MG PO TABS
5.0000 mg | ORAL_TABLET | ORAL | 0 refills | Status: DC | PRN
  Filled 2021-10-19: qty 12, 2d supply, fill #0

## 2021-10-19 MED ORDER — OXYCODONE HCL 5 MG PO TABS: 5.0000 mg | ORAL_TABLET | ORAL | 0 refills | Status: DC | PRN

## 2021-10-19 MED ORDER — ACETAMINOPHEN 500 MG PO TABS
1000.0000 mg | ORAL_TABLET | Freq: Four times a day (QID) | ORAL | 0 refills | Status: DC
  Filled 2021-10-19: qty 30, 4d supply, fill #0

## 2021-10-19 MED ORDER — FERROUS SULFATE 325 (65 FE) MG PO TABS
325.0000 mg | ORAL_TABLET | Freq: Every day | ORAL | 3 refills | Status: DC
  Filled 2021-10-19: qty 30, 30d supply, fill #0

## 2021-10-19 MED ORDER — IBUPROFEN 600 MG PO TABS
600.0000 mg | ORAL_TABLET | Freq: Four times a day (QID) | ORAL | 0 refills | Status: DC
  Filled 2021-10-19: qty 30, 8d supply, fill #0

## 2021-10-19 MED ORDER — SIMETHICONE 80 MG PO CHEW
80.0000 mg | CHEWABLE_TABLET | Freq: Three times a day (TID) | ORAL | 0 refills | Status: DC
  Filled 2021-10-19: qty 30, 10d supply, fill #0

## 2021-10-19 NOTE — Lactation Note (Signed)
This note was copied from a baby's chart. Lactation Consultation Note  Patient Name: Danielle Lowery [id number] Date: 10/19/2021 Reason for consult: Initial assessment Age:23 hours  Parent states she was not aware she could BF after her caesarean. RN notified [place] mom was wanting to BF.    Baby recently fed 40 ml of formula.  Baby sucking pacifier when LC entered.  LC encouraged STS.  Baby rooted to latch.  Mom was taught hand expression and LC assisted with positioning.    Baby latched and sucked on and off for 10 minutes.  Very sleepy and relaxed.   Parent's breasts are full and heavy.    Encouraged STS, offer both breasts when baby cues to feed.  Pump with her electric pump when baby is getting a bottle. Manual pump provided and use and cleaning explained.  Parent encouraged to BF first then supplement infant. Follow up strongly encouraged to assess BF and pumping needs and supplementation needs as well.    Parent aware of resources, epic message sent, encouraged parent to contact [facility] for BF support as well.       Maternal Data Has patient been taught Hand Expression?: Yes Does the patient have breastfeeding experience prior to this delivery?: No  Feeding Mother's Current Feeding Choice: Breast Milk and Formula Nipple Type: Slow - flow  LATCH Score Latch: Repeated attempts needed to sustain latch, nipple held in mouth throughout feeding, stimulation needed to elicit sucking reflex. (baby had recently eaten and was full)  Audible Swallowing: A few with stimulation  Type of Nipple: Everted at rest and after stimulation  Comfort (Breast/Nipple): Filling, red/small blisters or bruises, mild/mod discomfort (breasts are heavy and full.)  Hold (Positioning): Assistance needed to correctly position infant at breast and maintain latch.  LATCH Score: 6   Lactation Tools Discussed/Used    Interventions Interventions: Breast feeding basics reviewed;Skin to skin;Position  options;Support pillows;Breast compression;Breast massage;Hand pump  Discharge Discharge Education: Engorgement and breast care;Warning signs for feeding baby;Outpatient recommendation;Outpatient Epic message sent (parent latched upon DC for the first time) Pump: Manual  Consult Status Consult Status: Complete    Ferne Coe [facility] 10/19/2021, 3:10 PM

## 2021-10-19 NOTE — Discharge Summary (Cosign Needed Addendum)
Postpartum Discharge Summary  Date of Service updated 10/19/21     Patient Name: Danielle Lowery DOB: 2000-12-28 MRN: [id number]  Date of admission: 10/16/2021 Delivery date:10/16/2021  Delivering provider: Clarnce Flock  Date of discharge: 10/19/2021  Admitting diagnosis: HSV-2 infection complicating pregnancy [X11.552, B00.9] Delivery of pregnancy by cesarean section [O82] Intrauterine pregnancy: [redacted]w[redacted]d    Secondary diagnosis:  Principal Problem:   Delivery by cesarean section of full-term infant Active Problems:   Encounter for supervision of normal first pregnancy in second trimester   Late prenatal care   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 yo G1P1001 at 317w0das admitted to the hospital 10/16/2021 for scheduled cesarean section with the following indication: HSV .Delivery details are as follows:  Membrane Rupture Time/Date: 10:09 AM ,10/16/2021   Delivery Method:C-Section, Vacuum Assisted  Details of operation can be found in separate operative note.  Patient had a postpartum course complicated by N/A.  Stayed an extra day 2/2 baby. She is ambulating, tolerating a regular diet, passing flatus, and urinating well. Patient is discharged home in stable condition on  10/19/21        Newborn Data: Birth date:10/16/2021  Birth time:10:11 AM  Gender:Female  Living status:Living  Apgars:8 ,9  Weight:3200 g     Magnesium Sulfate received: No BMZ received: No Rhophylac:N/A MMR:N/A T-DaP:Given prenatally Flu: N/A Transfusion:No   Physical exam  Vitals:   10/17/21 2100 10/18/21 0523 10/18/21 2000 10/19/21 0509  BP:  120/68 119/71 126/75 131/84  Pulse: 84 84 66 91  Resp: _0 Temp: 98.4 F (36.9 C) 98.2 F (36.8 C) 98.9 F (37.2 C) (!) 97.3 F (36.3 C)  TempSrc: Oral Oral Oral Oral  SpO2: 100% 99% 100% 100%   General: alert Lochia: appropriate Uterine Fundus: firm Incision: Healing well with no significant drainage DVT Evaluation: No evidence of DVT seen on physical exam. Labs: Lab Results  Component Value Date   WBC 12.2 (H) 10/17/2021   HGB 8.6 (L) 10/17/2021   HCT 25.2 (L) 10/17/2021   MCV 81.3 10/17/2021   PLT 250 10/17/2021      Latest Ref Rng & Units 07/20/2017    5:39 AM  CMP  Glucose 70 - 99 mg/dL 110   BUN 6 - 20 mg/dL 12   Creatinine 0.44 - 1.00 mg/dL 0.66   Sodium 135 - 145 mmol/L 141   Potassium 3.5 - 5.1 mmol/L 4.1   Chloride 98 - 111 mmol/L 105   CO2 22 - 32 mmol/L 25   Calcium 8.9 - 10.3 mg/dL 9.5   Total Protein 6.5 - 8.1 g/dL 7.5   Total Bilirubin 0.3 - 1.2 mg/dL 0.7   Alkaline Phos 38 - 126 U/L 65   AST 15 - 41 U/L 19   ALT 0 - 44 U/L 15    Edinburgh Score:    10/17/2021    8:15 AM  Edinburgh Postnatal Depression Scale Screening Tool  I have  been able to laugh and see the funny side of things. 0  I have looked forward with enjoyment to things. 0  I have blamed myself unnecessarily when things went wrong. 1  I have been anxious or worried for no good reason. 1  I have felt scared or panicky for no good reason. 0  Things have been getting on top of me. 1  I have been so unhappy that I have had difficulty sleeping. 1  I have felt sad or miserable. 1  I have been so unhappy that I have been crying. 0  The thought of harming myself has occurred to me. 0  Edinburgh Postnatal Depression Scale Total 5     After visit meds:  Allergies as of 10/19/2021   No Known Allergies      Medication List     STOP taking these medications    Blood Pressure Kit Devi       TAKE these medications    acetaminophen 500 MG tablet Commonly known as:  TYLENOL Take 2 tablets (1,000 mg total) by mouth every 6 (six) hours.   ferrous sulfate 325 (65 FE) MG tablet Take 1 tablet (325 mg total) by mouth daily with breakfast.   ibuprofen 600 MG tablet Commonly known as: ADVIL Take 1 tablet (600 mg total) by mouth every 6 (six) hours.   oxyCODONE 5 MG immediate release tablet Commonly known as: Oxy IR/ROXICODONE Take 1 tablet (5 mg total) by mouth every 4 (four) hours as needed for up to 12 doses for moderate pain.   Prenatal Gummies 0.18-25 MG Chew Chew 1 tablet by mouth daily.   senna-docusate 8.6-50 MG tablet Commonly known as: Senokot-S Take 2 tablets by mouth daily.   simethicone 80 MG chewable tablet Commonly known as: MYLICON Chew 1 tablet (80 mg total) by mouth 3 (three) times daily after meals.   valACYclovir 500 MG tablet Commonly known as: VALTREX Take 1 tablet (500 mg total) by mouth 2 (two) times daily.               Discharge Care Instructions  (From admission, onward)           Start     Ordered   10/19/21 0000  Discharge wound care:       Comments: C-section wound care: You may feel pain/discomfort/burning sensation for several weeks. Keep the wound area clean by washing it with mild soap and water. You don't need to scrub it. Often, just letting the water run over your wound in the shower is enough. We do not recommend creams as this can cause infection, but we do recommend oral medication (prescribed), pressure dressings and running warm water on the wound to help ease discomfort/burning pain.   10/19/21 0802             Discharge home in stable condition Infant Feeding: Bottle and Breast Infant Disposition:home with mother Discharge instruction: per After Visit Summary and Postpartum booklet. Activity: Advance as tolerated. Pelvic rest for 6 weeks.  Diet: routine diet Future Appointments: Future Appointments  Date Time Provider [place]  10/23/2021 11:00 AM [place]  None  11/27/2021 10:55 AM Griffin Basil, MD [facility] None   Follow up Visit:  Message sent to [facility] on 10/9  Please schedule this patient for a In person postpartum visit in 6 weeks with the following provider: Any provider. Additional Postpartum F/U:Incision check 1 week  Low risk pregnancy complicated by:  n/a Delivery mode:  C-Section, Vacuum Assisted  Anticipated Birth Control:  Condoms   Shelda Pal, [place] Fellow, Faculty practice [place] for [place] 10/19/21  8:04 AM

## 2021-10-23 ENCOUNTER — Ambulatory Visit (INDEPENDENT_AMBULATORY_CARE_PROVIDER_SITE_OTHER): Payer: Medicaid Other | Admitting: General Practice

## 2021-10-23 ENCOUNTER — Encounter: Payer: Medicaid Other | Admitting: Advanced Practice Midwife

## 2021-10-23 DIAGNOSIS — Z4889 Encounter for other specified surgical aftercare: Secondary | ICD-10-CM

## 2021-10-23 NOTE — Progress Notes (Signed)
The wound is cleansed and debrided of foreign material as much as possible. Pt removed dressing 1 day ago. The patient is alerted to watch for any signs of infection (redness, pus, pain, increased swelling or fever) and call if such occurs. Pt verbalized understanding. Home wound care instructions are provided. Tetanus vaccination status reviewed: last tetanus booster within 10 years.

## 2021-10-30 ENCOUNTER — Encounter: Payer: Medicaid Other | Admitting: Obstetrics and Gynecology

## 2021-11-27 ENCOUNTER — Ambulatory Visit (INDEPENDENT_AMBULATORY_CARE_PROVIDER_SITE_OTHER): Payer: Medicaid Other | Admitting: Obstetrics and Gynecology

## 2021-11-27 NOTE — Progress Notes (Signed)
Post Partum Visit Note  Danielle Lowery is a 21 y.o. G2P1001 female who presents for a postpartum visit. She is 6 weeks postpartum following a primary cesarean section.  I have fully reviewed the prenatal and intrapartum course. The delivery was at 38 gestational weeks.  Anesthesia: spinal. Postpartum course has been uncomplicated. Baby is doing well. Baby is feeding by bottle - Similac Advance. Bleeding no bleeding. Bowel function is normal. Bladder function is normal. Patient is not sexually active. Contraception method is none.  Postpartum depression screening: negative, score 0.   The pregnancy intention screening data noted above was reviewed. Potential methods of contraception were discussed. The patient elected to proceed with No data recorded.   Edinburgh Postnatal Depression Scale - 11/27/21 1058       Edinburgh Postnatal Depression Scale:  In the Past 7 Days   I have been able to laugh and see the funny side of things. 0    I have looked forward with enjoyment to things. 0    I have blamed myself unnecessarily when things went wrong. 0    I have been anxious or worried for no good reason. 0    I have felt scared or panicky for no good reason. 0    Things have been getting on top of me. 0    I have been so unhappy that I have had difficulty sleeping. 0    I have felt sad or miserable. 0    I have been so unhappy that I have been crying. 0    The thought of harming myself has occurred to me. 0    Edinburgh Postnatal Depression Scale Total 0             Health Maintenance Due  Topic Date Due   COVID-19 Vaccine (1) Never done   HPV VACCINES (1 - 2-dose series) Never done   INFLUENZA VACCINE  Never done    The following portions of the patient's history were reviewed and updated as appropriate: allergies, current medications, past family history, past medical history, past social history, past surgical history, and problem list.  Review of Systems Pertinent items  are noted in HPI.  Objective:  BP 134/75   Pulse 90   Wt 168 lb (76.2 kg)   LMP 01/23/2021   BMI 33.93 kg/m    General:  alert, cooperative, and no distress   Breasts:  not indicated  Lungs: clear to auscultation bilaterally  Heart:  regular rate and rhythm  Abdomen: soft, non-tender; bowel sounds normal; no masses,  no organomegaly   Wound Clean dry and intact, no separation, induration or erythema  GU exam:  not indicated       Assessment:    Normal postpartum exam.   Plan:   Essential components of care per ACOG recommendations:  1.  Mood and well being: Patient with negative depression screening today. Reviewed local resources for support.  - Patient tobacco use? No.   - hx of drug use? No.    2. Infant care and feeding:  -Patient currently breastmilk feeding? No.  -Social determinants of health (SDOH) reviewed in EPIC. No concerns.  3. Sexuality, contraception and birth spacing - Patient does not want a pregnancy in the next year.  Desired family size is 3 children.  - Reviewed reproductive life planning. Reviewed contraceptive methods based on pt preferences and effectiveness.  Patient desired Female Condom today.  Discussed other options such as OCPs may have better success rates  with quick return of fertility, but pt declined. - Discussed birth spacing of 18 months  4. Sleep and fatigue -Encouraged family/partner/community support of 4 hrs of uninterrupted sleep to help with mood and fatigue  5. Physical Recovery  - Discussed patients delivery and complications. She describes her labor as good. - Patient had a C-section, no problems at delivery. Patient expressed understanding - Patient has urinary incontinence? No. - Patient is safe to resume physical and sexual activity  6.  Health Maintenance - HM due items addressed Yes - Last pap smear  Diagnosis  Date Value Ref Range Status  06/14/2021   Final   - Negative for intraepithelial lesion or malignancy  (NILM)   Pap smear not done at today's visit.  -Breast Cancer screening indicated? No.   7. Chronic Disease/Pregnancy Condition follow up: None  - PCP follow up F/u in 1 year or prn. Warden Fillers, MD Center for [employer], [facility]

## 2023-03-18 ENCOUNTER — Ambulatory Visit: Admitting: Obstetrics & Gynecology

## 2023-06-13 ENCOUNTER — Other Ambulatory Visit: Payer: Self-pay | Admitting: Obstetrics & Gynecology

## 2023-06-13 ENCOUNTER — Ambulatory Visit

## 2023-06-13 VITALS — BP 129/86 | HR 79

## 2023-06-13 DIAGNOSIS — Z3201 Encounter for pregnancy test, result positive: Secondary | ICD-10-CM | POA: Diagnosis not present

## 2023-06-13 DIAGNOSIS — Z3480 Encounter for supervision of other normal pregnancy, unspecified trimester: Secondary | ICD-10-CM | POA: Insufficient documentation

## 2023-06-13 LAB — POCT URINE PREGNANCY: Preg Test, Ur: POSITIVE — AB

## 2023-06-13 MED ORDER — VITAFOL GUMMIES 3.33-0.333-34.8 MG PO CHEW: 1.0000 | CHEWABLE_TABLET | Freq: Every day | ORAL | 5 refills | Status: DC

## 2023-06-13 NOTE — Progress Notes (Signed)
 Danielle Lowery here for a UPT. Pt had a positive upt at home. LMP is 03/18/23.     UPT in office Positive.    Reviewed medications and informed to start a PNV, if not already. Pt to follow up tomorrow for New OB Intake visit.

## 2023-06-14 ENCOUNTER — Other Ambulatory Visit (HOSPITAL_COMMUNITY)
Admission: RE | Admit: 2023-06-14 | Discharge: 2023-06-14 | Disposition: A | Discharge status: Home / Self Care | Source: Ambulatory Visit | Attending: Obstetrics | Admitting: Obstetrics

## 2023-06-14 ENCOUNTER — Other Ambulatory Visit: Payer: Self-pay

## 2023-06-14 ENCOUNTER — Ambulatory Visit: Admitting: *Deleted

## 2023-06-14 VITALS — BP 112/74 | HR 64 | Wt 169.0 lb

## 2023-06-14 DIAGNOSIS — O3680X0 Pregnancy with inconclusive fetal viability, not applicable or unspecified: Secondary | ICD-10-CM

## 2023-06-14 DIAGNOSIS — O26899 Other specified pregnancy related conditions, unspecified trimester: Secondary | ICD-10-CM | POA: Diagnosis present

## 2023-06-14 DIAGNOSIS — N898 Other specified noninflammatory disorders of vagina: Secondary | ICD-10-CM | POA: Diagnosis present

## 2023-06-14 DIAGNOSIS — O0990 Supervision of high risk pregnancy, unspecified, unspecified trimester: Secondary | ICD-10-CM

## 2023-06-14 DIAGNOSIS — Z3401 Encounter for supervision of normal first pregnancy, first trimester: Secondary | ICD-10-CM | POA: Diagnosis not present

## 2023-06-14 DIAGNOSIS — Z3A01 Less than 8 weeks gestation of pregnancy: Secondary | ICD-10-CM

## 2023-06-14 DIAGNOSIS — Z1331 Encounter for screening for depression: Secondary | ICD-10-CM

## 2023-06-14 MED ORDER — VITAFOL GUMMIES 3.33-0.333-34.8 MG PO CHEW: 3.0000 | CHEWABLE_TABLET | Freq: Every day | ORAL | 11 refills | Status: AC

## 2023-06-14 MED ORDER — BLOOD PRESSURE KIT DEVI: 1.0000 | 0 refills | Status: DC

## 2023-06-14 NOTE — Progress Notes (Signed)
 New OB Intake  US  shows probable early IUP at approximately [redacted]w[redacted]d. No FHR visualized today.  I connected with Coralee Derby  on 06/14/23 at  9:15 AM EDT by In Person Visit and verified that I am speaking with the correct person using two identifiers. Nurse is located at [facility] and pt is located at [place].  I discussed the limitations, risks, security and privacy concerns of performing an evaluation and management service by telephone and the availability of in person appointments. I also discussed with the patient that there may be a patient responsible charge related to this service. The patient expressed understanding and agreed to proceed.  I explained I am completing New OB Intake today. We discussed EDD of 12/23/2023, Date entered prior to episode creation. Pt is G2P1001. I reviewed her allergies, medications and Medical/Surgical/OB history.    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 Discussed the nature of our practice with multiple providers including residents and students. Due to the size of the practice, the delivering provider may not be the same as those providing prenatal care.   Patient is not a candidate for water  birth.  MyChart/Babyscripts MyChart access verified. I explained pt will have some visits in office and some virtually. Babyscripts instructions given and order placed. Patient verifies receipt of registration text/e-mail. Account successfully  created and app downloaded. If patient is a candidate for Optimized scheduling, add to sticky note.   Blood Pressure Cuff/Weight Scale Blood pressure cuff ordered for patient to pick-up from [employer]. Explained after first prenatal appt pt will check weekly and document in Babyscripts. Patient does not have weight scale; patient may purchase if they desire to track weight weekly in Babyscripts.  Anatomy US  Explained first scheduled US  will be around 19 weeks. Anatomy US  scheduled for NA at NA/ non viable today.  Is patient a candidate for Babyscripts Optimization? No, due to Risk Factors   First visit review I reviewed new OB appt with patient. Explained pt will be seen by TBD at first visit. Discussed [place] genetic screening with patient. Requests Panorama. Routine prenatal labs OB Urine, GC/CC collected today. Initial OB labs deferred until viability is confirmed.   Last Pap Diagnosis  Date Value Ref Range Status  06/14/2021   Final   - Negative for intraepithelial lesion or malignancy (NILM)    Donette Furlong, RN 06/14/2023  9:16 AM

## 2023-06-14 NOTE — Patient Instructions (Signed)

## 2023-06-17 LAB — CERVICOVAGINAL ANCILLARY ONLY
Bacterial Vaginitis (gardnerella): POSITIVE — AB
Candida Glabrata: NEGATIVE
Candida Vaginitis: NEGATIVE
Chlamydia: NEGATIVE
Comment: NEGATIVE
Comment: NEGATIVE
Comment: NEGATIVE
Comment: NEGATIVE
Comment: NEGATIVE
Comment: NORMAL
Neisseria Gonorrhea: NEGATIVE
Trichomonas: NEGATIVE

## 2023-06-20 ENCOUNTER — Other Ambulatory Visit: Payer: Self-pay

## 2023-06-20 MED ORDER — PROMETHAZINE HCL 25 MG PO TABS: 25.0000 mg | ORAL_TABLET | Freq: Four times a day (QID) | ORAL | 1 refills | Status: DC | PRN

## 2023-06-28 ENCOUNTER — Ambulatory Visit (HOSPITAL_COMMUNITY)
Admission: RE | Admit: 2023-06-28 | Discharge: 2023-06-28 | Disposition: A | Discharge status: Home / Self Care | Source: Ambulatory Visit | Attending: Obstetrics | Admitting: Obstetrics

## 2023-06-28 DIAGNOSIS — O3680X0 Pregnancy with inconclusive fetal viability, not applicable or unspecified: Secondary | ICD-10-CM | POA: Diagnosis present

## 2023-07-01 ENCOUNTER — Ambulatory Visit: Payer: Self-pay | Admitting: Obstetrics

## 2023-07-04 ENCOUNTER — Other Ambulatory Visit: Payer: Self-pay

## 2023-07-04 DIAGNOSIS — O0990 Supervision of high risk pregnancy, unspecified, unspecified trimester: Secondary | ICD-10-CM

## 2023-07-04 MED ORDER — DOXYLAMINE-PYRIDOXINE 10-10 MG PO TBEC: 2.0000 | DELAYED_RELEASE_TABLET | Freq: Every day | ORAL | 2 refills | Status: DC

## 2023-07-16 ENCOUNTER — Other Ambulatory Visit: Payer: Self-pay

## 2023-07-16 MED ORDER — METRONIDAZOLE 500 MG PO TABS: 500.0000 mg | ORAL_TABLET | Freq: Two times a day (BID) | ORAL | 0 refills | Status: DC

## 2023-08-06 ENCOUNTER — Ambulatory Visit: Admitting: Obstetrics & Gynecology

## 2023-08-06 VITALS — BP 129/81 | HR 96 | Wt 175.4 lb

## 2023-08-06 DIAGNOSIS — O98512 Other viral diseases complicating pregnancy, second trimester: Secondary | ICD-10-CM

## 2023-08-06 DIAGNOSIS — Z3A20 20 weeks gestation of pregnancy: Secondary | ICD-10-CM | POA: Diagnosis not present

## 2023-08-06 DIAGNOSIS — B009 Herpesviral infection, unspecified: Secondary | ICD-10-CM

## 2023-08-06 DIAGNOSIS — O0990 Supervision of high risk pregnancy, unspecified, unspecified trimester: Secondary | ICD-10-CM

## 2023-08-06 DIAGNOSIS — O34219 Maternal care for unspecified type scar from previous cesarean delivery: Secondary | ICD-10-CM | POA: Insufficient documentation

## 2023-08-06 NOTE — Progress Notes (Signed)
 NOB in office, intake completed on 06/14/23. U/s completed on 06/28/23 for dating  Pt reports no complaints today.

## 2023-08-06 NOTE — Progress Notes (Signed)
  Subjective:h/o cesarean section for HSV outbreak, hopes to VBAC    Danielle Lowery is a G2P1001 [redacted]w[redacted]d being seen today for her first obstetrical visit.  Her obstetrical history is significant for h/o cesarean section, h/o HSV. Patient does intend to breast feed. Pregnancy history fully reviewed.  Patient reports nausea and vomiting.  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    Past Surgical History:  Procedure Laterality Date   CESAREAN SECTION N/A 10/16/2021   Procedure: CESAREAN SECTION;  Surgeon: Lola Donnice HERO, MD;  Location: [facility] LD [facility]S;  Service: Obstetrics;  Laterality: N/A;   DENTAL SURGERY  2015   Family History  Problem Relation Age of Onset   Diabetes Mother    Diabetes Father    Diabetes Maternal Grandmother    Diabetes Maternal Grandfather    Diabetes Paternal Grandmother    Cancer Paternal Grandfather      Exam    Uterus:   13  Pelvic Exam:    Perineum:    Vulva:    Vagina:     pH:    Cervix:    Adnexa:    Bony Pelvis:   System: Breast:  Inspection negative   Skin: normal coloration and turgor, no rashes    Neurologic: oriented, normal mood   Extremities: normal strength, tone, and muscle mass   HEENT PERRLA and extra ocular movement intact   Mouth/Teeth mucous membranes moist, pharynx normal without lesions and dental hygiene good   Neck supple   Cardiovascular: regular rate and rhythm   Respiratory:  appears well, vitals normal, no respiratory distress, acyanotic, normal RR   Abdomen: soft, non-tender; bowel  sounds normal; no masses,  no organomegaly   Urinary:       Assessment:    Pregnancy: G2P1001 Patient Active Problem List   Diagnosis Date Noted   Supervision of high risk pregnancy, antepartum 06/14/2023   Supervision of other normal pregnancy, antepartum 06/13/2023   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 Prenatal vitamins. Problem list reviewed and updated. Genetic Screening discussed : ordered.  Ultrasound discussed; fetal survey: ordered.  Follow up in 4 weeks. 50% of 30 min visit spent on counseling and coordination of care.  Request to not start Valtrex  yet, last HSV sx were 4-5 months ago   Lynwood Solomons 08/06/2023

## 2023-08-07 LAB — CBC/D/PLT+RPR+RH+ABO+RUBIGG...
Antibody Screen: NEGATIVE
Basophils Absolute: 0 x10E3/uL (ref 0.0–0.2)
Basos: 0 %
EOS (ABSOLUTE): 0.1 x10E3/uL (ref 0.0–0.4)
Eos: 1 %
HCV Ab: NONREACTIVE
HIV Screen 4th Generation wRfx: NONREACTIVE
Hematocrit: 38.1 % (ref 34.0–46.6)
Hemoglobin: 13.2 g/dL (ref 11.1–15.9)
Hepatitis B Surface Ag: NEGATIVE
Immature Grans (Abs): 0 x10E3/uL (ref 0.0–0.1)
Immature Granulocytes: 0 %
Lymphocytes Absolute: 2 x10E3/uL (ref 0.7–3.1)
Lymphs: 18 %
MCH: 31.1 pg (ref 26.6–33.0)
MCHC: 34.6 g/dL (ref 31.5–35.7)
MCV: 90 fL (ref 79–97)
Monocytes Absolute: 0.5 x10E3/uL (ref 0.1–0.9)
Monocytes: 5 %
Neutrophils Absolute: 8.3 x10E3/uL — ABNORMAL HIGH (ref 1.4–7.0)
Neutrophils: 76 %
Platelets: 304 x10E3/uL (ref 150–450)
RBC: 4.25 x10E6/uL (ref 3.77–5.28)
RDW: 14 % (ref 11.7–15.4)
RPR Ser Ql: NONREACTIVE
Rh Factor: POSITIVE
Rubella Antibodies, IGG: 6.05 {index} (ref >0.99)
WBC: 11 x10E3/uL — ABNORMAL HIGH (ref 3.4–10.8)

## 2023-08-07 LAB — HCV INTERPRETATION

## 2023-09-03 ENCOUNTER — Ambulatory Visit: Admitting: Nurse Practitioner

## 2023-09-03 ENCOUNTER — Other Ambulatory Visit: Payer: Self-pay

## 2023-09-03 VITALS — BP 122/77 | HR 96 | Wt 170.8 lb

## 2023-09-03 DIAGNOSIS — Z3A17 17 weeks gestation of pregnancy: Secondary | ICD-10-CM

## 2023-09-03 DIAGNOSIS — O98512 Other viral diseases complicating pregnancy, second trimester: Secondary | ICD-10-CM

## 2023-09-03 DIAGNOSIS — O34219 Maternal care for unspecified type scar from previous cesarean delivery: Secondary | ICD-10-CM | POA: Diagnosis not present

## 2023-09-03 DIAGNOSIS — O26892 Other specified pregnancy related conditions, second trimester: Secondary | ICD-10-CM

## 2023-09-03 DIAGNOSIS — M5430 Sciatica, unspecified side: Secondary | ICD-10-CM | POA: Diagnosis not present

## 2023-09-03 DIAGNOSIS — O0990 Supervision of high risk pregnancy, unspecified, unspecified trimester: Secondary | ICD-10-CM

## 2023-09-03 DIAGNOSIS — Z3480 Encounter for supervision of other normal pregnancy, unspecified trimester: Secondary | ICD-10-CM

## 2023-09-03 DIAGNOSIS — M549 Dorsalgia, unspecified: Secondary | ICD-10-CM

## 2023-09-03 DIAGNOSIS — B009 Herpesviral infection, unspecified: Secondary | ICD-10-CM

## 2023-09-03 DIAGNOSIS — O99891 Other specified diseases and conditions complicating pregnancy: Secondary | ICD-10-CM

## 2023-09-03 DIAGNOSIS — R519 Headache, unspecified: Secondary | ICD-10-CM

## 2023-09-03 MED ORDER — MAGNESIUM OXIDE 400 MG PO TABS: 400.0000 mg | ORAL_TABLET | Freq: Every day | ORAL | 1 refills | Status: DC

## 2023-09-03 MED ORDER — CYCLOBENZAPRINE HCL 10 MG PO TABS: 10.0000 mg | ORAL_TABLET | Freq: Two times a day (BID) | ORAL | 0 refills | Status: DC | PRN

## 2023-09-03 NOTE — Progress Notes (Signed)
 Pt presents for rob. Pt has questions about what medications are safe for her to take for headaches. No other questions or concerns at this time.

## 2023-09-03 NOTE — Progress Notes (Signed)
 PRENATAL VISIT NOTE  Subjective:  Danielle Lowery is a 23 y.o. G2P1001 at [redacted]w[redacted]d being seen today for ongoing prenatal care.  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  Patient reports backache and sciatica .  Contractions: Not present. Vag. Bleeding: None.  Movement: Present. Denies leaking of fluid.   The following portions of the patient's history were reviewed and updated as appropriate: allergies, current medications, past family history, past medical history, past social history, past surgical history and problem list.   Objective:   Vitals:   09/03/23 1307  BP: 122/77  Pulse: 96  Weight: 170 lb 12.8 oz (77.5 kg)    Fetal Status: Fetal Heart Rate (bpm): 150 Fundal Height: 17 cm Movement: Present     General:  Alert, oriented and cooperative. Patient is in no acute distress.  Skin: Skin is warm and dry. No rash noted.   Cardiovascular: Normal heart rate noted  Respiratory: Normal respiratory effort, no problems with respiration noted  Abdomen: Soft, gravid, appropriate for gestational age.  Pain/Pressure: Present     Pelvic: Cervical exam deferred        Extremities: Normal range of motion.  Edema: None  Mental Status: Normal mood and affect. Normal behavior. Normal judgment and thought content.   Assessment and Plan:  Pregnancy: G2P1001 at [redacted]w[redacted]d  1. 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 History of cesarean delivery affecting pregnancy - Discuss mode of delivery later in pregnancy - TOLAC counseling if  desires  3. [redacted] weeks gestation of pregnancy  - AFP, Serum, Open Spina Bifida  4. Sciatica without back pain, unspecified laterality - Reports B/L pain starting in buttocks radiating down her legs B/L - Ambulatory referral to Physical Therapy - cyclobenzaprine  (FLEXERIL ) 10 MG tablet; Take 1 tablet (10 mg total) by mouth 2 (two) times daily as needed for muscle spasms.  Dispense: 20 tablet; Refill: 0  5. Back pain affecting pregnancy in second trimester - Maternity support band - Ambulatory referral to Physical Therapy - cyclobenzaprine  (FLEXERIL ) 10 MG tablet; Take 1 tablet (10 mg total) by mouth 2 (two) times daily as needed for muscle spasms.  Dispense: 20 tablet; Refill: 0  6. Headache in pregnancy, antepartum, second trimester - Reports has been having some mild headaches and was unsure what to take for relief other than Tylenol  - Safe meds in pregnancy list provided - magnesium  oxide (MAG-OX) 400 MG tablet; Take 1 tablet (400 mg total) by mouth daily.  Dispense: 90 tablet; Refill: 1   400 MG tablet; Take 1 tablet (400 mg total) by mouth daily.  Dispense: 90 tablet; Refill: 1  7. Supervision of other normal pregnancy, antepartum - FHR, FH rate appropriate     There are no diagnoses linked to this encounter. Preterm labor symptoms and general obstetric precautions including but not limited to vaginal bleeding, contractions, leaking of fluid and fetal movement were reviewed in detail with the patient. Please refer to After Visit Summary for other counseling recommendations.   Return in about 4 weeks (around 10/01/2023) for LOB.  Future Appointments  Date Time Provider Department Center  10/01/2023  2:50 PM Milly Olam LABOR, CNM [facility] None  10/10/2023  7:00 AM [facility] PROVIDER 1 [facility]  10/10/2023  7:30 AM [facility] US2 [facility]    Olam LABOR [place], NP

## 2023-09-03 NOTE — Patient Instructions (Signed)

## 2023-09-05 LAB — AFP, SERUM, OPEN SPINA BIFIDA
AFP MoM: 0.36
AFP Value: 12.7 ng/mL
Gest. Age on Collection Date: 17 wk
Maternal Age At EDD: 25.2 a
OSBR Risk 1 IN: 10000
Test Results:: NEGATIVE
Weight: 170 [lb_av]

## 2023-09-27 DIAGNOSIS — O99210 Obesity complicating pregnancy, unspecified trimester: Secondary | ICD-10-CM | POA: Insufficient documentation

## 2023-10-01 ENCOUNTER — Encounter: Payer: Self-pay | Admitting: Advanced Practice Midwife

## 2023-10-01 ENCOUNTER — Ambulatory Visit (INDEPENDENT_AMBULATORY_CARE_PROVIDER_SITE_OTHER): Admitting: Advanced Practice Midwife

## 2023-10-01 ENCOUNTER — Other Ambulatory Visit (HOSPITAL_COMMUNITY)
Admission: RE | Admit: 2023-10-01 | Discharge: 2023-10-01 | Disposition: A | Discharge status: Home / Self Care | Source: Ambulatory Visit | Attending: Advanced Practice Midwife | Admitting: Advanced Practice Midwife

## 2023-10-01 VITALS — BP 123/78 | HR 110 | Wt 175.0 lb

## 2023-10-01 DIAGNOSIS — R300 Dysuria: Secondary | ICD-10-CM | POA: Diagnosis not present

## 2023-10-01 DIAGNOSIS — O0990 Supervision of high risk pregnancy, unspecified, unspecified trimester: Secondary | ICD-10-CM

## 2023-10-01 DIAGNOSIS — Z3A21 21 weeks gestation of pregnancy: Secondary | ICD-10-CM | POA: Diagnosis not present

## 2023-10-01 DIAGNOSIS — N898 Other specified noninflammatory disorders of vagina: Secondary | ICD-10-CM | POA: Insufficient documentation

## 2023-10-01 NOTE — Progress Notes (Signed)
   PRENATAL VISIT NOTE  Subjective:  Danielle Lowery is a 23 y.o. G2P1001 at [redacted]w[redacted]d being seen today for ongoing prenatal care.  She is currently monitored for the following issues for this low-risk pregnancy and has HSV-2 infection complicating pregnancy; Supervision of other normal pregnancy, antepartum; Supervision of high risk pregnancy, antepartum; History of cesarean delivery affecting pregnancy; and Obesity affecting pregnancy, antepartum on their problem list.  Patient reports dysuria and vaginal itching.  Contractions: Not present. Vag. Bleeding: None.  Movement: Present. Denies leaking of fluid.   The following portions of the patient's history were reviewed and updated as appropriate: allergies, current medications, past family history, past medical history, past social history, past surgical history and problem list.   Objective:    Vitals:   10/01/23 1517  BP: 123/78  Pulse: (!) 110  Weight: 175 lb (79.4 kg)    Fetal Status:  Fetal Heart Rate (bpm): 143   Movement: Present    General: Alert, oriented and cooperative. Patient is in no acute distress.  Skin: Skin is warm and dry. No rash noted.   Cardiovascular: Normal heart rate noted  Respiratory: Normal respiratory effort, no problems with respiration noted  Abdomen: Soft, gravid, appropriate for gestational age.  Pain/Pressure: Absent     Pelvic: Cervical exam deferred        Extremities: Normal range of motion.  Edema: None  Mental Status: Normal mood and affect. Normal behavior. Normal judgment and thought content.   Assessment and Plan:  Pregnancy: G2P1001 at [redacted]w[redacted]d 1. Supervision of high risk pregnancy, antepartum --Anticipatory guidance about next visits/weeks of pregnancy given.   2. [redacted] weeks gestation of pregnancy   3. Dysuria (Primary)  - Culture, OB Urine  4. Vaginal itching  - Cervicovaginal ancillary only( [place])   Preterm labor symptoms and general obstetric precautions including but not  limited to vaginal bleeding, contractions, leaking of fluid and fetal movement were reviewed in detail with the patient. Please refer to After Visit Summary for other counseling recommendations.   Return in about 4 weeks (around 10/29/2023) for LOB, Any provider.  Future Appointments  Date Time Provider Department Center  10/10/2023  7:00 AM [facility] PROVIDER 1 [facility]  10/10/2023  7:30 AM [facility] US2 [facility]  10/29/2023 10:15 AM Anyanwu, Gloris LABOR, MD [facility] None    Olam Boards, CNM

## 2023-10-01 NOTE — Progress Notes (Signed)
 Pt reports discharge that is odorous and itchy. Pt would like to discuss vinegar baths with provider.   Asking for refill of phenergan .

## 2023-10-03 LAB — CERVICOVAGINAL ANCILLARY ONLY
Bacterial Vaginitis (gardnerella): POSITIVE — AB
Candida Glabrata: NEGATIVE
Candida Vaginitis: NEGATIVE
Chlamydia: NEGATIVE
Comment: NEGATIVE
Comment: NEGATIVE
Comment: NEGATIVE
Comment: NEGATIVE
Comment: NEGATIVE
Comment: NORMAL
Neisseria Gonorrhea: NEGATIVE
Trichomonas: NEGATIVE

## 2023-10-03 LAB — URINE CULTURE, OB REFLEX

## 2023-10-03 LAB — CULTURE, OB URINE

## 2023-10-04 ENCOUNTER — Other Ambulatory Visit

## 2023-10-04 DIAGNOSIS — R7309 Other abnormal glucose: Secondary | ICD-10-CM

## 2023-10-04 DIAGNOSIS — O0990 Supervision of high risk pregnancy, unspecified, unspecified trimester: Secondary | ICD-10-CM

## 2023-10-05 LAB — GLUCOSE TOLERANCE, 2 HOURS W/ 1HR
Glucose, 1 hour: 169 mg/dL (ref 70–179)
Glucose, 2 hour: 141 mg/dL (ref 70–152)
Glucose, Fasting: 76 mg/dL (ref 70–91)

## 2023-10-06 ENCOUNTER — Ambulatory Visit: Payer: Self-pay | Admitting: Obstetrics and Gynecology

## 2023-10-07 ENCOUNTER — Ambulatory Visit: Payer: Self-pay | Admitting: Advanced Practice Midwife

## 2023-10-07 MED ORDER — METRONIDAZOLE 500 MG PO TABS: 500.0000 mg | ORAL_TABLET | Freq: Two times a day (BID) | ORAL | 0 refills | Status: AC

## 2023-10-10 ENCOUNTER — Ambulatory Visit: Attending: Obstetrics and Gynecology | Admitting: Obstetrics and Gynecology

## 2023-10-10 ENCOUNTER — Ambulatory Visit

## 2023-10-10 ENCOUNTER — Other Ambulatory Visit: Payer: Self-pay | Admitting: *Deleted

## 2023-10-10 VITALS — BP 122/63 | HR 91

## 2023-10-10 DIAGNOSIS — Z3A22 22 weeks gestation of pregnancy: Secondary | ICD-10-CM | POA: Insufficient documentation

## 2023-10-10 DIAGNOSIS — O0990 Supervision of high risk pregnancy, unspecified, unspecified trimester: Secondary | ICD-10-CM

## 2023-10-10 DIAGNOSIS — Z363 Encounter for antenatal screening for malformations: Secondary | ICD-10-CM | POA: Diagnosis not present

## 2023-10-10 DIAGNOSIS — B009 Herpesviral infection, unspecified: Secondary | ICD-10-CM | POA: Insufficient documentation

## 2023-10-10 DIAGNOSIS — O34219 Maternal care for unspecified type scar from previous cesarean delivery: Secondary | ICD-10-CM | POA: Diagnosis not present

## 2023-10-10 DIAGNOSIS — O99212 Obesity complicating pregnancy, second trimester: Secondary | ICD-10-CM

## 2023-10-10 DIAGNOSIS — O09899 Supervision of other high risk pregnancies, unspecified trimester: Secondary | ICD-10-CM

## 2023-10-10 DIAGNOSIS — O99210 Obesity complicating pregnancy, unspecified trimester: Secondary | ICD-10-CM

## 2023-10-10 DIAGNOSIS — E669 Obesity, unspecified: Secondary | ICD-10-CM

## 2023-10-10 DIAGNOSIS — O98512 Other viral diseases complicating pregnancy, second trimester: Secondary | ICD-10-CM

## 2023-10-10 DIAGNOSIS — O0992 Supervision of high risk pregnancy, unspecified, second trimester: Secondary | ICD-10-CM | POA: Insufficient documentation

## 2023-10-10 DIAGNOSIS — O09892 Supervision of other high risk pregnancies, second trimester: Secondary | ICD-10-CM | POA: Diagnosis not present

## 2023-10-10 NOTE — Progress Notes (Signed)
 Maternal-Fetal Medicine Consultation  Name: Danielle Lowery  MRN: [id number]  GA: [id number] [redacted]w[redacted]d   Patient is here for fetal anatomy scan.  Her problems include: - Previous cesarean delivery. Obstetric history is significant for a term cesarean delivery in October 2023 that was performed because of primary genital herpes infection.  Her son is in good health. -Short interval between pregnancies. -Pregravid BMI 35. - History of genital herpes infection.  Patient currently does not have genital lesions.  On cell-free fetal DNA screening, the risks of fetal aneuploidies are not increased.  MSAFP screening showed low risk for open neural tube defects. Hemoglobin A1c was 6.1%.  Early screening ruled out gestational diabetes. Her pregnancy is dated by 7-week ultrasound.  Ultrasound Fetal biometry is consistent with the previously established dates.  Normal amniotic fluid. No markers of aneuploidies or obvious fetal structural defects are seen. Placenta is fundal and there is no evidence of previa or placenta accreta spectrum.  Our concerns include: Previous cesarean delivery I reassured the patient of normal placental location and that there is no evidence of previa or placenta accreta spectrum.  I discussed the benefits and risks of VBAC.  The risk of uterine scar dehiscence is about 1%.  Cesarean deliveries increase the risks of hemorrhage, infection and venous thromboembolism.  Repeat cesarean deliveries increase the risks of placenta previa and/or placenta accreta spectrum. Long-term complications include small bowel obstruction.  Based on [facility] calculator, the likelihood of successful vaginal delivery is 50%. Patient was counseled that this is only an approximate calculation.  Patient is keen on VBAC.  Short Interpregnancy Interval It is defined as the time interval (18 to 24 months) between the end of previous pregnancy and the beginning of next  pregnancy. The impact of short pregnancy interval on the outcome of subsequent pregnancy is uncertain. Some studies have shown that congenital anomalies, preterm delivery, and fetal growth restriction rates are increased in pregnancies with short interpregnancy interval.  However, it is not supported by other reports. Overall, we should expect good pregnancy outcomes if there are no other high-risk factors.  History of genital herpes infection Patient reports she does not have genital lesions.  I discussed suppressive therapy around 35 to [redacted] weeks gestation.  If she has recurrent genital herpes lesions, cesarean section will be recommended.  Recommendations - An appointment was made for her to return in 10 weeks for fetal growth assessment.     Consultation including face-to-face (more than 50%) counseling 30 minutes.

## 2023-10-22 ENCOUNTER — Encounter: Payer: Self-pay | Admitting: Advanced Practice Midwife

## 2023-10-29 ENCOUNTER — Ambulatory Visit: Admitting: Obstetrics & Gynecology

## 2023-10-29 ENCOUNTER — Encounter: Payer: Self-pay | Admitting: Obstetrics & Gynecology

## 2023-10-29 VITALS — BP 138/83 | HR 100 | Wt 177.2 lb

## 2023-10-29 DIAGNOSIS — O0990 Supervision of high risk pregnancy, unspecified, unspecified trimester: Secondary | ICD-10-CM

## 2023-10-29 DIAGNOSIS — O34219 Maternal care for unspecified type scar from previous cesarean delivery: Secondary | ICD-10-CM

## 2023-10-29 DIAGNOSIS — Z3A25 25 weeks gestation of pregnancy: Secondary | ICD-10-CM | POA: Diagnosis not present

## 2023-10-29 NOTE — Patient Instructions (Signed)
 Oral Glucose Tolerance Test During Pregnancy Why am I having this test? The oral glucose tolerance test (GTT) is done to check how your body processes blood sugar (glucose). This is one of several tests used to diagnose diabetes that develops during pregnancy (gestational diabetes mellitus). Gestational diabetes is a short-term form of diabetes that some women develop while they are pregnant. It usually occurs during the second or third trimester of pregnancy and goes away after delivery. Testing, or screening, for gestational diabetes usually occurs around 69 of pregnancy. This test may also be needed earlier if: You have a history of gestational diabetes. There is a history of giving birth to very large babies or of losing pregnancies (having stillbirths). You have signs and symptoms of diabetes, such as: Changes in your eyesight. Tingling or numbness in your hands or feet. Changes in hunger, thirst, and urination, and these are not explained by your pregnancy. What is being tested? This test measures the amount of glucose in your blood at different times during a period of 2 hours. This shows how well your body can process glucose.  You will have three separate blood draws. What kind of sample is taken?  Blood samples are required for this test. They are usually collected by inserting a needle into a blood vessel. How do I prepare for this test? For 3 days before your test, eat normally. Have plenty of carbohydrate-rich foods. You will be asked not to eat or drink anything other than water (to fast) starting 8-10 hours before the test. Tell a health care provider about: All medicines you are taking, including vitamins, herbs, eye drops, creams, and over-the-counter medicines. Any blood disorders you have. Any surgeries you have had. Any medical conditions you have. What happens during the test? First, your blood glucose will be measured. This is referred to as your fasting blood glucose  because you fasted before the test. Then, you will drink a glucose solution that contains a certain amount of glucose. Your blood glucose will be measured again 1 and 2 hours after you drink the solution. This test takes about 2 hours to complete. You will need to stay at the testing location during this time. During the testing period: Do not eat or drink anything other than the glucose solution. Do not exercise. Do not use any products that contain nicotine or tobacco, such as cigarettes, e-cigarettes, and chewing tobacco. These can affect your test results. If you need help quitting, ask your health care provider. The testing procedure may vary among health care providers and hospitals. How are the results reported? Your results will be reported as milligrams of glucose per deciliter of blood (mg/dL) or millimoles per liter (mmol/L). There is more than one source for screening and diagnosis reference values used to diagnose gestational diabetes. Your health care provider will compare your results to normal values that were established after testing a large group of people (reference values). Reference values may vary among labs and hospitals. For this test, reference values are: Fasting: 92 mg/dL 1 hour: 161 mg/dL  2 hour: 096 mg/dL   What do the results mean? Results below the reference values are considered normal. If one or more of your blood glucose levels are at or above the reference values, you will be diagnosed with gestational diabetes.  Talk with your health care provider about what your results mean. Questions to ask your health care provider Ask your health care provider, or the department that is doing the test: When  will my results be ready? How will I get my results? What are my treatment options? What other tests do I need? What are my next steps? Summary The oral glucose tolerance test (GTT) is one of several tests used to diagnose diabetes that develops during pregnancy  (gestational diabetes mellitus). Gestational diabetes is a short-term form of diabetes that some women develop while they are pregnant. You may also have this test if you have any symptoms or risk factors for this type of diabetes. Talk with your health care provider about what your results mean. This information is not intended to replace advice given to you by your health care provider. Make sure you discuss any questions you have with your health care provider.  TDaP Vaccine Pregnancy Get the Whooping Cough Vaccine While You Are Pregnant (CDC)  It is important for women to get the whooping cough vaccine in the third trimester of each pregnancy. Vaccines are the best way to prevent this disease. There are 2 different whooping cough vaccines. Both vaccines combine protection against whooping cough, tetanus and diphtheria, but they are for different age groups: Tdap: for everyone 11 years or older, including pregnant women  DTaP: for children 2 months through 45 years of age  You need the whooping cough vaccine during each of your pregnancies The recommended time to get the shot is during your 27th through 36th week of pregnancy, preferably during the earlier part of this time period. The Centers for Disease Control and Prevention (CDC) recommends that pregnant women receive the whooping cough vaccine for adolescents and adults (called Tdap vaccine) during the third trimester of each pregnancy. The recommended time to get the shot is during your 27th through 36th week of pregnancy, preferably during the earlier part of this time period. This replaces the original recommendation that pregnant women get the vaccine only if they had not previously received it. The [employer] and the [employer] support this recommendation.  You should get the whooping cough vaccine while pregnant to pass protection to your baby frame support disabled  and/or not supported in this browser  Learn why Vernona Rieger decided to get the whooping cough vaccine in her 3rd trimester of pregnancy and how her baby girl was born with some protection against the disease. Also available on YouTube. After receiving the whooping cough vaccine, your body will create protective antibodies (proteins produced by the body to fight off diseases) and pass some of them to your baby before birth. These antibodies provide your baby some short-term protection against whooping cough in early life. These antibodies can also protect your baby from some of the more serious complications that come along with whooping cough. Your protective antibodies are at their highest about 2 weeks after getting the vaccine, but it takes time to pass them to your baby. So the preferred time to get the whooping cough vaccine is early in your third trimester. The amount of whooping cough antibodies in your body decreases over time. That is why CDC recommends you get a whooping cough vaccine during each pregnancy. Doing so allows each of your babies to get the greatest number of protective antibodies from you. This means each of your babies will get the best protection possible against this disease.  Getting the whooping cough vaccine while pregnant is better than getting the vaccine after you give birth Whooping cough vaccination during pregnancy is ideal so your baby will have short-term protection as soon as he  is born. This early protection is important because your baby will not start getting his whooping cough vaccines until he is 2 months old. These first few months of life are when your baby is at greatest risk for catching whooping cough. This is also when he's at greatest risk for having severe, potentially life-threating complications from the infection. To avoid that gap in protection, it is best to get a whooping cough vaccine during pregnancy. You will then pass protection to your baby before he  is born. To continue protecting your baby, he should get whooping cough vaccines starting at 2 months old. You may never have gotten the Tdap vaccine before and did not get it during this pregnancy. If so, you should make sure to get the vaccine immediately after you give birth, before leaving the hospital or birthing center. It will take about 2 weeks before your body develops protection (antibodies) in response to the vaccine. Once you have protection from the vaccine, you are less likely to give whooping cough to your newborn while caring for him. But remember, your baby will still be at risk for catching whooping cough from others. A recent study looked to see how effective Tdap was at preventing whooping cough in babies whose mothers got the vaccine while pregnant or in the hospital after giving birth. The study found that getting Tdap between 27 through 36 weeks of pregnancy is 85% more effective at preventing whooping cough in babies younger than 2 months old. Blood tests cannot tell if you need a whooping cough vaccine There are no blood tests that can tell you if you have enough antibodies in your body to protect yourself or your baby against whooping cough. Even if you have been sick with whooping cough in the past or previously received the vaccine, you still should get the vaccine during each pregnancy. Breastfeeding may pass some protective antibodies onto your baby By breastfeeding, you may pass some antibodies you have made in response to the vaccine to your baby. When you get a whooping cough vaccine during your pregnancy, you will have antibodies in your breast milk that you can share with your baby as soon as your milk comes in. However, your baby will not get protective antibodies immediately if you wait to get the whooping cough vaccine until after delivering your baby. This is because it takes about 2 weeks for your body to create antibodies. Learn more about the health benefits of  breastfeeding.

## 2023-10-29 NOTE — Progress Notes (Signed)
   PRENATAL VISIT NOTE  Subjective:  Danielle Lowery is a 23 y.o. G2P1001 at [redacted]w[redacted]d being seen today for ongoing prenatal care.  She is currently monitored for the following issues for this high-risk pregnancy and has HSV-2 infection complicating pregnancy; Supervision of other normal pregnancy, antepartum; Supervision of high risk pregnancy, antepartum; History of cesarean delivery affecting pregnancy; and Obesity affecting pregnancy, antepartum on their problem list.  Patient reports no complaints.  Contractions: Not present. Vag. Bleeding: None.  Movement: Present. Denies leaking of fluid.   The following portions of the patient's history were reviewed and updated as appropriate: allergies, current medications, past family history, past medical history, past social history, past surgical history and problem list.   Objective:    Vitals:   10/29/23 1035  BP: 138/83  Pulse: 100  Weight: 177 lb 3.2 oz (80.4 kg)    Fetal Status:  Fetal Heart Rate (bpm): 141 Fundal Height: 26 cm Movement: Present    General: Alert, oriented and cooperative. Patient is in no acute distress.  Skin: Skin is warm and dry. No rash noted.   Cardiovascular: Normal heart rate noted  Respiratory: Normal respiratory effort, no problems with respiration noted  Abdomen: Soft, gravid, appropriate for gestational age.  Pain/Pressure: Present     Pelvic: Cervical exam deferred        Extremities: Normal range of motion.  Edema: None  Mental Status: Normal mood and affect. Normal behavior. Normal judgment and thought content.   Assessment and Plan:  Pregnancy: G2P1001 at [redacted]w[redacted]d 1. History of cesarean delivery affecting pregnancy (Primary) Will discuss TOLAC vs RCS at next visit.  2. [redacted] weeks gestation of pregnancy 3. Supervision of high risk pregnancy, antepartum Normal GTT in 09/2623; this will be repeated in third trimester with other labs. TDap information given. Preterm labor symptoms and general obstetric  precautions including but not limited to vaginal bleeding, contractions, leaking of fluid and fetal movement were reviewed in detail with the patient. Please refer to After Visit Summary for other counseling recommendations.   Return in about 4 weeks (around 11/26/2023) for 2 hr GTT, 3rd trimester labs, TDap, OFFICE OB VISIT (MD or APP).  Future Appointments  Date Time Provider Department Center  11/25/2023  8:00 AM [facility] None  11/25/2023  9:55 AM Rudy Carlin LABOR, MD [facility] None  12/19/2023  2:00 PM [facility] PROVIDER 1 [facility]  12/19/2023  2:15 PM [facility] US4 [facility]    Gloris Hugger, MD

## 2023-11-25 ENCOUNTER — Encounter: Payer: Self-pay | Admitting: Obstetrics

## 2023-11-25 ENCOUNTER — Other Ambulatory Visit

## 2023-11-25 ENCOUNTER — Ambulatory Visit: Payer: Self-pay | Admitting: Obstetrics

## 2023-11-25 VITALS — BP 129/76 | HR 101 | Wt 182.7 lb

## 2023-11-25 DIAGNOSIS — Z3A28 28 weeks gestation of pregnancy: Secondary | ICD-10-CM | POA: Diagnosis not present

## 2023-11-25 DIAGNOSIS — O99210 Obesity complicating pregnancy, unspecified trimester: Secondary | ICD-10-CM

## 2023-11-25 DIAGNOSIS — O99213 Obesity complicating pregnancy, third trimester: Secondary | ICD-10-CM | POA: Diagnosis not present

## 2023-11-25 DIAGNOSIS — O34219 Maternal care for unspecified type scar from previous cesarean delivery: Secondary | ICD-10-CM | POA: Diagnosis not present

## 2023-11-25 DIAGNOSIS — B009 Herpesviral infection, unspecified: Secondary | ICD-10-CM

## 2023-11-25 DIAGNOSIS — Z3480 Encounter for supervision of other normal pregnancy, unspecified trimester: Secondary | ICD-10-CM

## 2023-11-25 DIAGNOSIS — O98513 Other viral diseases complicating pregnancy, third trimester: Secondary | ICD-10-CM | POA: Diagnosis not present

## 2023-11-25 DIAGNOSIS — Z23 Encounter for immunization: Secondary | ICD-10-CM

## 2023-11-25 NOTE — Progress Notes (Signed)
 Subjective:  Danielle Lowery is a 23 y.o. G2P1001 at [redacted]w[redacted]d being seen today for ongoing prenatal care.  She is currently monitored for the following issues for this high-risk pregnancy and has HSV-2 infection complicating pregnancy; Supervision of other normal pregnancy, antepartum; Supervision of high risk pregnancy, antepartum; History of cesarean delivery affecting pregnancy; and Obesity affecting pregnancy, antepartum on their problem list.  Patient reports no complaints.  Contractions: Not present. Vag. Bleeding: None.  Movement: Present. Denies leaking of fluid.   The following portions of the patient's history were reviewed and updated as appropriate: allergies, current medications, past family history, past medical history, past social history, past surgical history and problem list. Problem list updated.  Objective:   Vitals:   11/25/23 0958  BP: 129/76  Pulse: (!) 101  Weight: 182 lb 11.2 oz (82.9 kg)    Fetal Status: Fetal Heart Rate (bpm): 146   Movement: Present     General:  Alert, oriented and cooperative. Patient is in no acute distress.  Skin: Skin is warm and dry. No rash noted.   Cardiovascular: Normal heart rate noted  Respiratory: Normal respiratory effort, no problems with respiration noted  Abdomen: Soft, gravid, appropriate for gestational age. Pain/Pressure: Absent     Pelvic:  Cervical exam deferred        Extremities: Normal range of motion.  Edema: None  Mental Status: Normal mood and affect. Normal behavior. Normal judgment and thought content.   Urinalysis:      Assessment and Plan:  Pregnancy: G2P1001 at [redacted]w[redacted]d  1. Supervision of other normal pregnancy, antepartum (Primary) Rx: - Flu vaccine trivalent PF, 6mos and older(Flulaval,Afluria,Fluarix,Fluzone)  2. [redacted] weeks gestation of pregnancy Rx: - Glucose Tolerance, 2 Hours w/1 Hour - HIV antibody (with reflex) - CBC - RPR  3. History of cesarean delivery affecting pregnancy - desires  TOLAC  4. Herpes simplex virus type 2 (HSV-2) infection affecting pregnancy in third trimester - start Valtrex  suppression at 32 weeks  5. Obesity affecting pregnancy, antepartum, unspecified obesity type    Preterm labor symptoms and general obstetric precautions including but not limited to vaginal bleeding, contractions, leaking of fluid and fetal movement were reviewed in detail with the patient. Please refer to After Visit Summary for other counseling recommendations.   Return in about 2 weeks (around 12/09/2023) for ROB.   Rudy Carlin LABOR, MD 11/25/2023

## 2023-11-25 NOTE — Progress Notes (Signed)
 Pt presents for rob. Pt has no questions or concerns at this time.

## 2023-11-26 ENCOUNTER — Ambulatory Visit: Payer: Self-pay | Admitting: Obstetrics

## 2023-11-26 DIAGNOSIS — O24410 Gestational diabetes mellitus in pregnancy, diet controlled: Secondary | ICD-10-CM

## 2023-11-26 LAB — GLUCOSE TOLERANCE, 2 HOURS W/ 1HR
Glucose, 1 hour: 185 mg/dL — ABNORMAL HIGH (ref 70–179)
Glucose, 2 hour: 119 mg/dL (ref 70–152)
Glucose, Fasting: 72 mg/dL (ref 70–91)

## 2023-11-26 LAB — HIV ANTIBODY (ROUTINE TESTING W REFLEX): HIV Screen 4th Generation wRfx: NONREACTIVE

## 2023-11-26 LAB — CBC
Hematocrit: 35.4 % (ref 34.0–46.6)
Hemoglobin: 11.3 g/dL (ref 11.1–15.9)
MCH: 27.6 pg (ref 26.6–33.0)
MCHC: 31.9 g/dL (ref 31.5–35.7)
MCV: 86 fL (ref 79–97)
Platelets: 293 x10E3/uL (ref 150–450)
RBC: 4.1 x10E6/uL (ref 3.77–5.28)
RDW: 13.3 % (ref 11.7–15.4)
WBC: 9.9 x10E3/uL (ref 3.4–10.8)

## 2023-11-26 LAB — RPR: RPR Ser Ql: NONREACTIVE

## 2023-11-27 ENCOUNTER — Other Ambulatory Visit: Payer: Self-pay

## 2023-11-27 MED ORDER — ACCU-CHEK GUIDE TEST VI STRP: ORAL_STRIP | 12 refills | Status: DC

## 2023-11-27 MED ORDER — ACCU-CHEK GUIDE W/DEVICE KIT: 1.0000 | PACK | Freq: Four times a day (QID) | 0 refills | Status: DC

## 2023-11-27 MED ORDER — ACCU-CHEK SOFTCLIX LANCETS MISC: 12 refills | Status: DC

## 2023-11-27 NOTE — Progress Notes (Signed)
 Patient was seen on 12/04/2023 for Gestational Diabetes self-management class at the [facility]. The following learning objectives were met by the patient during this group class of three.   Class start: 1015 Class end: 1035  States the definition of Gestational Diabetes States why dietary management is important in controlling blood glucose Describes the effects each nutrient has on blood glucose levels Demonstrates ability to create a balanced meal plan Demonstrates carbohydrate counting  States when to check blood glucose levels Demonstrates proper blood glucose monitoring techniques States the effect of stress and exercise on blood glucose levels States the importance of limiting caffeine and abstaining from alcohol and smoking   Accu chek- Patient has a meter prior to visit. Patient is instructed to begin testing pre breakfast and 2 hours after each meal. Blood glucose today in class 101 mg/dL, reported as 2 hour post prandial per Pt  Patient instructed to monitor glucose levels:  QID FBS: 60 - <95 2 hour: <120  Patient received handouts:  Nutrition Diabetes and Pregnancy Carbohydrate Counting List Blood glucose log Snack ideas for diabetes during pregnancy Plate Planner  Patient will be seen for follow-up as needed.

## 2023-12-04 ENCOUNTER — Encounter: Attending: Obstetrics | Admitting: Dietician

## 2023-12-04 DIAGNOSIS — O24410 Gestational diabetes mellitus in pregnancy, diet controlled: Secondary | ICD-10-CM | POA: Insufficient documentation

## 2023-12-06 ENCOUNTER — Encounter (HOSPITAL_COMMUNITY): Payer: Self-pay | Admitting: Obstetrics and Gynecology

## 2023-12-06 ENCOUNTER — Inpatient Hospital Stay (HOSPITAL_COMMUNITY)
Admission: AD | Admit: 2023-12-06 | Discharge: 2023-12-06 | Disposition: A | Discharge status: Home / Self Care | Attending: Obstetrics and Gynecology | Admitting: Obstetrics and Gynecology

## 2023-12-06 ENCOUNTER — Other Ambulatory Visit: Payer: Self-pay

## 2023-12-06 DIAGNOSIS — O99891 Other specified diseases and conditions complicating pregnancy: Secondary | ICD-10-CM | POA: Diagnosis not present

## 2023-12-06 DIAGNOSIS — R000 Tachycardia, unspecified: Secondary | ICD-10-CM

## 2023-12-06 DIAGNOSIS — Z3A30 30 weeks gestation of pregnancy: Secondary | ICD-10-CM | POA: Insufficient documentation

## 2023-12-06 DIAGNOSIS — O26893 Other specified pregnancy related conditions, third trimester: Secondary | ICD-10-CM

## 2023-12-06 NOTE — MAU Provider Note (Signed)
 History     CSN: [id number]  Arrival date and time: 12/06/23 9973   Event Date/Time   First Provider Initiated Contact with Patient 12/06/23 0043      Chief Complaint  Patient presents with   Tachycardia    Danielle Lowery is a 23 y.o. G2P1001 at [redacted]w[redacted]d who receives care at [facility].  She presents today for elevated HR.  She states she was notified, on her Apple watch, that her heart rate was above 120 twice today.  She states each episode lasted ~ 10 minutes and occurred while she was sitting down. She denies pain or discomfort including cramping or contractions.  No vaginal concerns.  She endorses fetal movement.   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     Past Surgical History:  Procedure Laterality Date   CESAREAN SECTION N/A 10/16/2021   Procedure: CESAREAN SECTION;  Surgeon: Lola Donnice HERO, MD;  Location: [facility] LD ORS;  Service: Obstetrics;  Laterality: N/A;   DENTAL SURGERY  2015    Family History  Problem Relation Age of Onset   Diabetes Mother    Diabetes Father    Diabetes Maternal Grandmother    Diabetes Maternal Grandfather    Diabetes Paternal Grandmother    Cancer Paternal Grandfather     Social History   Tobacco Use   Smoking status: Never   Smokeless tobacco: Never  Vaping Use   Vaping status: Never Used  Substance Use Topics   Alcohol use: No   Drug use: No    Allergies: No Known Allergies  Medications Prior to Admission  Medication Sig Dispense Refill Last Dose/Taking   magnesium  oxide (MAG-OX) 400 MG tablet  Take 1 tablet (400 mg total) by mouth daily. 90 tablet 1 Past Week   Prenatal Vit-Fe Phos-FA-Omega (VITAFOL  GUMMIES) 3.33-0.333-34.8 MG CHEW Chew 3 tablets by mouth daily. 90 tablet 11 12/05/2023   Accu-Chek Softclix Lancets lancets Use as instructed 100 each 12    acetaminophen  (TYLENOL ) 500 MG tablet Take 2 tablets (1,000 mg total) by mouth every 6 (six) hours. (Patient not taking: Reported on 11/25/2023) 30 tablet 0    Blood Glucose Monitoring Suppl (ACCU-CHEK GUIDE) w/Device KIT 1 Device by Does not apply route 4 (four) times daily. 1 kit 0    Blood Pressure Monitoring (BLOOD PRESSURE KIT) DEVI 1 Device by Does not apply route once a week. 1 each 0    cyclobenzaprine  (FLEXERIL ) 10 MG tablet Take 1 tablet (10 mg total) by mouth 2 (two) times daily as needed for muscle spasms. 20 tablet 0    Doxylamine -Pyridoxine  (DICLEGIS ) 10-10 MG TBEC Take 2 tablets by mouth at bedtime. If symptoms persist, add one tablet in the morning and one in the afternoon (Patient not taking: Reported on 11/25/2023) 100 tablet 2    ferrous sulfate  325 (  65 FE) MG tablet Take 1 tablet (325 mg total) by mouth daily with breakfast. (Patient not taking: Reported on 11/25/2023) 30 tablet 3    glucose blood (ACCU-CHEK GUIDE TEST) test strip Use as instructed 100 each 12    oxyCODONE  (OXY IR/ROXICODONE ) 5 MG immediate release tablet Take 1 tablet (5 mg total) by mouth every 4 (four) hours as needed for up to 12 doses for moderate pain. (Patient not taking: Reported on 11/25/2023) 12 tablet 0    promethazine  (PHENERGAN ) 25 MG tablet Take 1 tablet (25 mg total) by mouth every 6 (six) hours as needed for nausea or vomiting. (Patient not taking: Reported on 11/25/2023) 30 tablet 1    senna-docusate (SENOKOT-S) 8.6-50 MG tablet Take 2 tablets by mouth daily. (Patient not taking: Reported on 11/25/2023) 30 tablet 0    simethicone  (MYLICON) 80 MG chewable tablet Chew 1 tablet (80 mg total) by mouth 3 (three) times daily after meals.  (Patient not taking: Reported on 11/25/2023) 30 tablet 0    valACYclovir  (VALTREX ) 500 MG tablet Take 1 tablet (500 mg total) by mouth 2 (two) times daily. (Patient not taking: Reported on 11/25/2023) 60 tablet 6     Review of Systems  Gastrointestinal:  Negative for abdominal pain, nausea and vomiting.  Genitourinary:  Negative for vaginal bleeding and vaginal discharge.  Musculoskeletal:  Negative for back pain.  Neurological:  Negative for dizziness, light-headedness and headaches.   Physical Exam   Blood pressure 123/72, pulse (!) 104, temperature 97.6 F (36.4 C), temperature source Oral, resp. rate 20, height 4' 11 (1.499 m), weight 86 kg, last menstrual period 03/18/2023, SpO2 99%.  Physical Exam Vitals reviewed. Exam conducted with a chaperone present [facility], [employer]).  Constitutional:      Appearance: Normal appearance.  HENT:     Head: Normocephalic and atraumatic.  Eyes:     Conjunctiva/sclera: Conjunctivae normal.  Cardiovascular:     Rate and Rhythm: Tachycardia present.  Pulmonary:     Effort: Pulmonary effort is normal. No respiratory distress.     Breath sounds: Normal breath sounds.  Abdominal:     Palpations: Abdomen is soft.     Tenderness: There is no abdominal tenderness.     Comments: Gravid--fundal height appears LGA, Soft, NT   Musculoskeletal:        General: Normal range of motion.     Cervical back: Normal range of motion.  Skin:    General: Skin is warm and dry.  Neurological:     Mental Status: She is alert and oriented to person, place, and time.  Psychiatric:        Mood and Affect: Mood normal.        Behavior: Behavior normal.     MAU Course  Procedures  MDM EKG Referral Assessment and Plan  23 year old, G2P1001  SIUP at 30.5 weeks Tachycardia  -Reviewed POC with patient. -Exam performed and findings discussed.  -Reassured that some bouts of tachycardia can be normal during pregnancy with consideration to extra fluid/blood  circulating through body.  -Informed that EKG to be performed and if normal will discharge and send for referral to cardiology for further evaluation.  -Patient agreeable with plan. -EkG normal. -Referral placed. --Encouraged to call primary office or return to MAU if symptoms worsen or with the onset of new symptoms. -Discharged to home in stable condition.  Harlene LITTIE Duncans MSN, CNM Advanced Practice Provider, [facility] 12/06/2023, 12:43 AM

## 2023-12-06 NOTE — MAU Note (Addendum)
 Danielle Lowery is a 23 y.o. at [redacted]w[redacted]d here in MAU reporting: around 1700 got notification on apple watch HR was above 120. Around 2230 got another notification. States she was resting both times she got the notifications. Called on call OB number and was told to come in for evaluation. Denies pain, VB, or LOF. +FM  Pain score: 0 Vitals:   12/06/23 0035  BP: 123/72  Pulse: (!) 104  Resp: 20  Temp: 97.6 F (36.4 C)  SpO2: 99%     FHT: 140  Lab orders placed from triage: none  0038 [name], CNM in triage.

## 2023-12-09 ENCOUNTER — Ambulatory Visit: Admitting: Obstetrics

## 2023-12-09 ENCOUNTER — Encounter: Payer: Self-pay | Admitting: Obstetrics

## 2023-12-09 VITALS — BP 132/77 | HR 113 | Wt 188.3 lb

## 2023-12-09 DIAGNOSIS — O24410 Gestational diabetes mellitus in pregnancy, diet controlled: Secondary | ICD-10-CM | POA: Insufficient documentation

## 2023-12-09 DIAGNOSIS — O0990 Supervision of high risk pregnancy, unspecified, unspecified trimester: Secondary | ICD-10-CM

## 2023-12-09 DIAGNOSIS — O34219 Maternal care for unspecified type scar from previous cesarean delivery: Secondary | ICD-10-CM | POA: Diagnosis not present

## 2023-12-09 DIAGNOSIS — O98519 Other viral diseases complicating pregnancy, unspecified trimester: Secondary | ICD-10-CM

## 2023-12-09 DIAGNOSIS — B009 Herpesviral infection, unspecified: Secondary | ICD-10-CM

## 2023-12-09 DIAGNOSIS — O99210 Obesity complicating pregnancy, unspecified trimester: Secondary | ICD-10-CM

## 2023-12-09 MED ORDER — VALACYCLOVIR HCL 1 G PO TABS: 1000.0000 mg | ORAL_TABLET | Freq: Two times a day (BID) | ORAL | 11 refills | Status: DC | PRN

## 2023-12-09 NOTE — Progress Notes (Addendum)
 Pt presents for rob. Pt wants to know if it is normal for her feet to hurt. No other questions or concerns at this time.

## 2023-12-09 NOTE — Progress Notes (Addendum)
 Subjective:  Danielle Lowery is a 23 y.o. G2P1001 at [redacted]w[redacted]d being seen today for ongoing prenatal care.  She is currently monitored for the following issues for this high-risk pregnancy and has HSV-2 infection complicating pregnancy; Supervision of other normal pregnancy, antepartum; Supervision of high risk pregnancy, antepartum; History of cesarean delivery affecting pregnancy; Obesity affecting pregnancy, antepartum; and Gestational diabetes, diet controlled on their problem list.  Patient reports no complaints.  Contractions: Not present. Vag. Bleeding: None.  Movement: Present. Denies leaking of fluid.   The following portions of the patient's history were reviewed and updated as appropriate: allergies, current medications, past family history, past medical history, past social history, past surgical history and problem list. Problem list updated.  Objective:   Vitals:   12/09/23 1052  BP: 132/77  Pulse: (!) 113  Weight: 188 lb 4.8 oz (85.4 kg)    Fetal Status: Fetal Heart Rate (bpm): 146   Movement: Present     General:  Alert, oriented and cooperative. Patient is in no acute distress.  Skin: Skin is warm and dry. No rash noted.   Cardiovascular: Normal heart rate noted  Respiratory: Normal respiratory effort, no problems with respiration noted  Abdomen: Soft, gravid, appropriate for gestational age. Pain/Pressure: Present     Pelvic:  Cervical exam deferred        Extremities: Normal range of motion.  Edema: None  Mental Status: Normal mood and affect. Normal behavior. Normal judgment and thought content.   Urinalysis:      Assessment and Plan:  Pregnancy: G2P1001 at [redacted]w[redacted]d  1. Supervision of high risk pregnancy, antepartum (Primary)  2. Diet controlled gestational diabetes mellitus (GDM), antepartum - good glucose control:  FBS < 95   and   2 Hour PP < 120  3. History of cesarean delivery affecting pregnancy - desires VBAC  4. Herpes simplex type 2 (HSV-2) infection  affecting pregnancy, antepartum - start Valtrex  suppression  5. Obesity affecting pregnancy, antepartum, unspecified obesity type   Preterm labor symptoms and general obstetric precautions including but not limited to vaginal bleeding, contractions, leaking of fluid and fetal movement were reviewed in detail with the patient. Please refer to After Visit Summary for other counseling recommendations.  Return in about 2 weeks (around 12/23/2023) for [facility].   Rudy Carlin LABOR, MD 12/09/2023

## 2023-12-09 NOTE — Addendum Note (Signed)
 Addended by: RUDY DUNNINGS A on: 12/09/2023 11:51 AM   Modules accepted: Orders

## 2023-12-11 ENCOUNTER — Encounter: Payer: Self-pay | Admitting: Obstetrics

## 2023-12-11 ENCOUNTER — Other Ambulatory Visit: Payer: Self-pay

## 2023-12-11 MED ORDER — ACCU-CHEK GUIDE TEST VI STRP: ORAL_STRIP | 12 refills | Status: DC

## 2023-12-17 ENCOUNTER — Encounter: Payer: Self-pay | Admitting: Obstetrics

## 2023-12-17 ENCOUNTER — Ambulatory Visit

## 2023-12-17 DIAGNOSIS — O24410 Gestational diabetes mellitus in pregnancy, diet controlled: Secondary | ICD-10-CM

## 2023-12-19 ENCOUNTER — Ambulatory Visit

## 2023-12-23 ENCOUNTER — Ambulatory Visit: Admitting: Obstetrics

## 2023-12-23 ENCOUNTER — Encounter: Payer: Self-pay | Admitting: Obstetrics

## 2023-12-23 VITALS — Wt 189.7 lb

## 2023-12-23 DIAGNOSIS — O0990 Supervision of high risk pregnancy, unspecified, unspecified trimester: Secondary | ICD-10-CM

## 2023-12-23 DIAGNOSIS — O24410 Gestational diabetes mellitus in pregnancy, diet controlled: Secondary | ICD-10-CM | POA: Diagnosis not present

## 2023-12-23 DIAGNOSIS — O99210 Obesity complicating pregnancy, unspecified trimester: Secondary | ICD-10-CM | POA: Diagnosis not present

## 2023-12-23 DIAGNOSIS — B009 Herpesviral infection, unspecified: Secondary | ICD-10-CM | POA: Diagnosis not present

## 2023-12-23 DIAGNOSIS — O34219 Maternal care for unspecified type scar from previous cesarean delivery: Secondary | ICD-10-CM | POA: Diagnosis not present

## 2023-12-23 DIAGNOSIS — O98519 Other viral diseases complicating pregnancy, unspecified trimester: Secondary | ICD-10-CM | POA: Diagnosis not present

## 2023-12-23 NOTE — Progress Notes (Signed)
 Subjective:  Danielle Lowery is a 23 y.o. G2P1001 at [redacted]w[redacted]d being seen today for ongoing prenatal care.  She is currently monitored for the following issues for this high-risk pregnancy and has HSV-2 infection complicating pregnancy; Supervision of other normal pregnancy, antepartum; Supervision of high risk pregnancy, antepartum; History of cesarean delivery affecting pregnancy; Obesity affecting pregnancy, antepartum; and Gestational diabetes, diet controlled on their problem list.  Patient reports no complaints.  Contractions: Irritability. Vag. Bleeding: None.  Movement: Present. Denies leaking of fluid.   The following portions of the patient's history were reviewed and updated as appropriate: allergies, current medications, past family history, past medical history, past social history, past surgical history and problem list. Problem list updated.  Objective:   Vitals:   12/23/23 1557  Weight: 189 lb 11.2 oz (86 kg)    Fetal Status:     Movement: Present     General:  Alert, oriented and cooperative. Patient is in no acute distress.  Skin: Skin is warm and dry. No rash noted.   Cardiovascular: Normal heart rate noted  Respiratory: Normal respiratory effort, no problems with respiration noted  Abdomen: Soft, gravid, appropriate for gestational age. Pain/Pressure: Present     Pelvic:  Cervical exam deferred        Extremities: Normal range of motion.  Edema: None  Mental Status: Normal mood and affect. Normal behavior. Normal judgment and thought content.   Urinalysis:      Assessment and Plan:  Pregnancy: G2P1001 at [redacted]w[redacted]d  1. Supervision of high risk pregnancy, antepartum (Primary)  2. Diet controlled gestational diabetes mellitus (GDM) in third trimester - good glucose control:  FBS < 100   and   2 Hour PP < 120  3. History of cesarean delivery affecting pregnancy - desires VBAC.  Consent signed.  4. Herpes simplex type 2 (HSV-2) infection affecting pregnancy,  antepartum - start Valtrex  suppression  5. Obesity affecting pregnancy, antepartum, unspecified obesity type   Preterm labor symptoms and general obstetric precautions including but not limited to vaginal bleeding, contractions, leaking of fluid and fetal movement were reviewed in detail with the patient. Please refer to After Visit Summary for other counseling recommendations.   Return in about 2 weeks (around 01/06/2024) for ROB.   Rudy Carlin LABOR, MD 12/23/2023

## 2023-12-23 NOTE — Progress Notes (Signed)
 GDM; pt didn't bring log with her today.   Would like to discuss Tdap vaccine with provider  No other concerns

## 2023-12-25 ENCOUNTER — Ambulatory Visit: Admitting: Obstetrics and Gynecology

## 2023-12-25 ENCOUNTER — Ambulatory Visit: Attending: Obstetrics and Gynecology

## 2023-12-25 ENCOUNTER — Other Ambulatory Visit: Payer: Self-pay | Admitting: *Deleted

## 2023-12-25 VITALS — BP 112/73

## 2023-12-25 DIAGNOSIS — O99213 Obesity complicating pregnancy, third trimester: Secondary | ICD-10-CM

## 2023-12-25 DIAGNOSIS — O98513 Other viral diseases complicating pregnancy, third trimester: Secondary | ICD-10-CM | POA: Diagnosis not present

## 2023-12-25 DIAGNOSIS — B009 Herpesviral infection, unspecified: Secondary | ICD-10-CM

## 2023-12-25 DIAGNOSIS — O34219 Maternal care for unspecified type scar from previous cesarean delivery: Secondary | ICD-10-CM

## 2023-12-25 DIAGNOSIS — O24410 Gestational diabetes mellitus in pregnancy, diet controlled: Secondary | ICD-10-CM

## 2023-12-25 DIAGNOSIS — O99210 Obesity complicating pregnancy, unspecified trimester: Secondary | ICD-10-CM | POA: Diagnosis present

## 2023-12-25 DIAGNOSIS — O0990 Supervision of high risk pregnancy, unspecified, unspecified trimester: Secondary | ICD-10-CM

## 2023-12-25 DIAGNOSIS — Z3A33 33 weeks gestation of pregnancy: Secondary | ICD-10-CM

## 2023-12-25 DIAGNOSIS — O09899 Supervision of other high risk pregnancies, unspecified trimester: Secondary | ICD-10-CM | POA: Insufficient documentation

## 2023-12-25 DIAGNOSIS — E669 Obesity, unspecified: Secondary | ICD-10-CM

## 2023-12-25 DIAGNOSIS — E6689 Other obesity not elsewhere classified: Secondary | ICD-10-CM

## 2023-12-25 NOTE — Progress Notes (Unsigned)
 Patient reports that she checks blood glucose at home and readings are as follows: Fasting levels range from *** to 96, and two hour post-meal reading ranges from ***.to 165.

## 2023-12-25 NOTE — Progress Notes (Signed)
 Maternal-Fetal Medicine Consultation  Name: Danielle Lowery  MRN: [id number]  GA: [id number] [redacted]w[redacted]d   Patient is here for fetal growth assessment.  She has a new diagnosis of gestational diabetes.  She reports that most of her fasting levels are below 95 mg//dL and postprandial levels are up to 160 mg/dL.  She checks her blood glucose regularly.   Blood pressure today in our office 112/73 mmHg.  Ultrasound Normal fetal growth and amniotic fluid.  Cephalic presentation.  Good fetal activity is seen. Gestational diabetes I explained the diagnosis of gestational diabetes.  I emphasized the importance of good blood glucose control to prevent adverse fetal or neonatal outcomes.  I discussed normal range of blood glucose values. I encouraged her to check her blood glucose regularly.  Possible complications of gestational diabetes include fetal macrosomia, shoulder dystocia and birth injuries, stillbirth (in poorly controlled diabetes) and neonatal respiratory syndrome and other complications.  In about 85% of cases, gestational diabetes is well controlled by diet alone.  Exercise reduces the need for insulin.  Medical treatment includes oral hypoglycemics or insulin. Patient was counseled that if her fasting and postprandial levels are not within normal range, metformin will be recommended.  Timing of delivery: In well-controlled diabetes on diet, patient can be delivered at 39- or 40-weeks' gestation. Patient is keen on VBAC. Type 2 diabetes develops in up to 50% of women with GDM. I recommend postpartum screening with 75-g glucose load at 6 to 12 weeks after delivery.  Recommendations -Weekly NST at your office if blood glucose levels are not within normal range. -Consider initiating metformin if diabetes is not well-controlled. -An appointment was made for to return in 4 weeks for fetal growth assessment. - Postpartum screening (6 weeks to 12 weeks) for type 2 diabetes.     Consultation  including face-to-face (more than 50%) counseling 25 minutes.

## 2023-12-27 ENCOUNTER — Encounter: Payer: Self-pay | Admitting: Cardiology

## 2023-12-27 ENCOUNTER — Ambulatory Visit: Admitting: Cardiology

## 2023-12-27 VITALS — BP 132/68 | HR 97 | Ht 59.0 in | Wt 189.9 lb

## 2023-12-27 DIAGNOSIS — R002 Palpitations: Secondary | ICD-10-CM

## 2023-12-27 DIAGNOSIS — Z79899 Other long term (current) drug therapy: Secondary | ICD-10-CM

## 2023-12-27 DIAGNOSIS — Z3A33 33 weeks gestation of pregnancy: Secondary | ICD-10-CM | POA: Diagnosis not present

## 2023-12-27 NOTE — Patient Instructions (Signed)
 Medication Instructions:  Your physician recommends that you continue on your current medications as directed. Please refer to the Current Medication list given to you today.  *If you need a refill on your cardiac medications before your next appointment, please call your pharmacy*  Lab Work: CMET, Mag If you have labs (blood work) drawn today and your tests are completely normal, you will receive your results only by: MyChart Message (if you have MyChart) OR A paper copy in the mail If you have any lab test that is abnormal or we need to change your treatment, we will call you to review the results.   Follow-Up: At [facility], you and your health needs are our priority.  As part of our continuing mission to provide you with exceptional heart care, our providers are all part of one team.  This team includes your primary Cardiologist (physician) and Advanced Practice Providers or APPs (Physician Assistants and Nurse Practitioners) who all work together to provide you with the care you need, when you need it.  Your next appointment:    As needed  Provider:   Kardie Tobb, DO

## 2023-12-27 NOTE — Progress Notes (Unsigned)
 "  Cardio-Obstetrics Clinic  New Evaluation  Date:  12/28/2023   ID:  Danielle Lowery, DOB [birth year], MRN [id number]  PCP:  Pcp, No   [facility] Providers Cardiologist:  None  Electrophysiologist:  None       Referring MD: Danielle Lowery, CNM   Chief Complaint:  I am doing well  History of Present Illness:    Danielle Lowery is a 23 y.o. female [G2P1001] who is being seen today for the evaluation of tachycardia in pregnancy at the request of Danielle Lowery, CNM.   who presents with episodes of increased heart rate during pregnancy.  She tells me that during Thanksgiving she had recurrent episodes of elevated heart rate at rest, detected by her Apple Watch, occurring every one to two hours, which prompted an ER visit. She has not had recurrent episodes since and feels well today. Her home and recent clinic blood pressure readings have been normal. She has been more active caring for her son and niece without recurrence of palpitations or tachycardia. She is not aware of heart disease in her family. She does not use energy drinks, coffee, or tea.   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 08/26/2021   HSV PCR positive  IGG negative, indicated primary outbreak diagnosed at 31 wks     Herpes simplex virus (HSV) infection    Prediabetes in mother during pregnancy     Past Surgical History:  Procedure Laterality Date   CESAREAN SECTION N/A 10/16/2021   Procedure: CESAREAN SECTION;  Surgeon: Lola Donnice HERO, MD;  Location: [facility] LD ORS;  Service: Obstetrics;  Laterality: N/A;   DENTAL SURGERY  2015      OB History     Gravida  2   Para  1   Term  1   Preterm  0   AB  0   Living  1      SAB  0   IAB  0   Ectopic  0   Multiple  0   Live Births  1               Current  Medications: Active Medications[1]   Allergies:   Patient has no known allergies.   Social History   Socioeconomic History   Marital status: Single    Spouse name: Not on file   Number of children: Not on file   Years of education: Not on file   Highest education level: Not on file  Occupational History   Not on file  Tobacco Use   Smoking status: Never   Smokeless tobacco: Never  Vaping Use   Vaping status: Never Used  Substance and Sexual Activity   Alcohol use: No   Drug use: No   Sexual activity: Yes    Partners: Male  Other Topics Concern   Not on file  Social History Narrative   Not on file   Social Drivers of Health   Tobacco Use: Low Risk (12/27/2023)   Patient History    Smoking Tobacco Use: Never    Smokeless Tobacco Use: Never    Passive Exposure: Not on file  Financial Resource Strain: Low Risk (05/20/2023)   Received from [facility]   Overall Financial Resource Strain (CARDIA)    Difficulty of Paying Living Expenses: Not hard at all  Food Insecurity: No Food Insecurity (  12/04/2023)   Epic    Worried About Programme Researcher, Broadcasting/film/video in the Last Year: Never true    Ran Out of Food in the Last Year: Never true  Transportation Needs: No Transportation Needs (05/20/2023)   Received from [facility] - Transportation    Lack of Transportation (Medical): No    Lack of Transportation (Non-Medical): No  Physical Activity: Inactive (05/20/2023)   Received from [facility]   Exercise Vital Sign    On average, how many days per week do you engage in moderate to strenuous exercise (like a brisk walk)?: 0 days    On average, how many minutes do you engage in exercise at this level?: 10 min  Stress: No Stress Concern Present (05/20/2023)   Received from [facility] Occupational [facility] - Occupational Stress Questionnaire    Feeling of Stress : Not at all  Social Connections: Not on file  Depression (PHQ2-9): Low Risk (12/04/2023)    Depression (PHQ2-9)    PHQ-2 Score: 0  Alcohol Screen: Not on file  Housing: Low Risk (05/20/2023)   Received from [facility]    In the last 12 months, was there a time when you were not able to pay the mortgage or rent on time?: No    In the past 12 months, how many times have you moved where you were living?: 0    At any time in the past 12 months, were you homeless or living in a shelter (including now)?: No  Utilities: Not At Risk (05/20/2023)   Received from [facility] Utilities    Threatened with loss of utilities: No  Health Literacy: Not on file      Family History  Problem Relation Age of Onset   Diabetes Mother    Diabetes Father    Diabetes Maternal Grandmother    Diabetes Maternal Grandfather    Diabetes Paternal Grandmother    Cancer Paternal Grandfather       ROS:   Please see the history of present illness.    palpitations All other systems reviewed and are negative.   Labs/EKG Reviewed:    EKG:   EKG was not ordered today.    Recent Labs: 11/25/2023: Hemoglobin 11.3; Platelets 293   Recent Lipid Panel No results found for: CHOL, TRIG, HDL, CHOLHDL, LDLCALC, LDLDIRECT  Physical Exam:    VS:  BP 132/68 (BP Location: Left Arm, Patient Position: Sitting, Cuff Size: Normal)   Pulse 97   Ht 4' 11 (1.499 m)   Wt 189 lb 14.4 oz (86.1 kg)   LMP 03/18/2023 (Exact Date)   SpO2 97%   BMI 38.36 kg/m     Wt Readings from Last 3 Encounters:  12/27/23 189 lb 14.4 oz (86.1 kg)  12/23/23 189 lb 11.2 oz (86 kg)  12/09/23 188 lb 4.8 oz (85.4 kg)     GEN:  Well nourished, well developed in no acute distress HEENT: Normal NECK: No JVD; No carotid bruits LYMPHATICS: No lymphadenopathy CARDIAC: RRR, no murmurs, rubs, gallops RESPIRATORY:  Clear to auscultation without rales, wheezing or rhonchi  ABDOMEN: Soft, non-tender, non-distended MUSCULOSKELETAL:  No edema; No deformity  SKIN: Warm and dry NEUROLOGIC:  Alert and  oriented x 3 PSYCHIATRIC:  Normal affect    Risk Assessment/Risk Calculators:     CARPREG II Risk Prediction Index Score:  1.  The patient's risk for a primary cardiac event is 5%.  Modified World Health Organization [facility]) Classification of Maternal CV Risk   Class I         ASSESSMENT & PLAN:    Palpitations Intermittent palpitations likely due to dietary factors. Symptoms resolved, indicating a transient cause. - Ordered blood work for electrolytes. - Advised increased fluid intake. - Instructed to send MyChart message if palpitations recur. - Consider monitor if symptoms persist.  Supervision of normal pregnancy, third trimester [redacted] weeks pregnant, no significant complications. Previous C-section due to herpes outbreak. - Monitor for new symptoms or complications.  Patient Instructions  Medication Instructions:  Your physician recommends that you continue on your current medications as directed. Please refer to the Current Medication list given to you today.  *If you need a refill on your cardiac medications before your next appointment, please call your pharmacy*  Lab Work: CMET, Mag If you have labs (blood work) drawn today and your tests are completely normal, you will receive your results only by: MyChart Message (if you have MyChart) OR A paper copy in the mail If you have any lab test that is abnormal or we need to change your treatment, we will call you to review the results.   Follow-Up: At [facility], you and your health needs are our priority.  As part of our continuing mission to provide you with exceptional heart care, our providers are all part of one team.  This team includes your primary Cardiologist (physician) and Advanced Practice Providers or APPs (Physician Assistants and Nurse Practitioners) who all work together to provide you with the care you need, when you need it.  Your next appointment:    As needed  Provider:   Benjimin Hadden, DO      Dispo:  No follow-ups on file.   Medication Adjustments/Labs and Tests Ordered: Current medicines are reviewed at length with the patient today.  Concerns regarding medicines are outlined above.  Tests Ordered: Orders Placed This Encounter  Procedures   Comp Met (CMET)   Magnesium    Medication Changes: No orders of the defined types were placed in this encounter.     [1]  Current Meds  Medication Sig   Accu-Chek Softclix Lancets lancets Use as instructed   Blood Glucose Monitoring Suppl (ACCU-CHEK GUIDE) w/Device KIT 1 Device by Does not apply route 4 (four) times daily.   Blood Pressure Monitoring (BLOOD PRESSURE KIT) DEVI 1 Device by Does not apply route once a week.   glucose blood (ACCU-CHEK GUIDE TEST) test strip 4 times daily   magnesium  oxide (MAG-OX) 400 MG tablet Take 1 tablet (400 mg total) by mouth daily.   Prenatal Vit-Fe Phos-FA-Omega (VITAFOL  GUMMIES) 3.33-0.333-34.8 MG CHEW Chew 3 tablets by mouth daily.   valACYclovir  (VALTREX ) 1000 MG tablet Take 1 tablet (1,000 mg total) by mouth 2 (two) times daily as needed. Take 1 tablet po daily after 31 weeks pregnancy until after delivery of baby.   "

## 2024-01-08 ENCOUNTER — Ambulatory Visit: Payer: Self-pay | Admitting: Student

## 2024-01-08 VITALS — BP 128/79 | HR 95 | Wt 191.2 lb

## 2024-01-08 DIAGNOSIS — O24410 Gestational diabetes mellitus in pregnancy, diet controlled: Secondary | ICD-10-CM | POA: Diagnosis not present

## 2024-01-08 DIAGNOSIS — O98513 Other viral diseases complicating pregnancy, third trimester: Secondary | ICD-10-CM | POA: Diagnosis not present

## 2024-01-08 DIAGNOSIS — O34219 Maternal care for unspecified type scar from previous cesarean delivery: Secondary | ICD-10-CM

## 2024-01-08 DIAGNOSIS — O0993 Supervision of high risk pregnancy, unspecified, third trimester: Secondary | ICD-10-CM

## 2024-01-08 DIAGNOSIS — Z3A35 35 weeks gestation of pregnancy: Secondary | ICD-10-CM | POA: Diagnosis not present

## 2024-01-08 DIAGNOSIS — B009 Herpesviral infection, unspecified: Secondary | ICD-10-CM | POA: Diagnosis not present

## 2024-01-08 DIAGNOSIS — O0990 Supervision of high risk pregnancy, unspecified, unspecified trimester: Secondary | ICD-10-CM

## 2024-01-08 NOTE — Progress Notes (Signed)
 "  PRENATAL VISIT NOTE  Subjective:  Danielle Lowery is a 23 y.o. G2P1001 at [redacted]w[redacted]d being seen today for ongoing prenatal care.  She is currently monitored for the following issues for this low-risk pregnancy and has HSV-2 infection complicating pregnancy; Supervision of other normal pregnancy, antepartum; Supervision of high risk pregnancy, antepartum; History of cesarean delivery affecting pregnancy; Obesity affecting pregnancy, antepartum; and Gestational diabetes, diet controlled on their problem list.  Patient reports no complaints.  Contractions: Irritability. Vag. Bleeding: None.  Movement: Present. Denies leaking of fluid.   The following portions of the patient's history were reviewed and updated as appropriate: allergies, current medications, past family history, past medical history, past social history, past surgical history and problem list.   Objective:   Vitals:   01/08/24 0903  BP: 128/79  Pulse: 95  Weight: 191 lb 3.2 oz (86.7 kg)    Fetal Status:  Fetal Heart Rate (bpm): 140   Movement: Present    General: Alert, oriented and cooperative. Patient is in no acute distress.  Skin: Skin is warm and dry. No rash noted.   Cardiovascular: Normal heart rate noted  Respiratory: Normal respiratory effort, no problems with respiration noted  Abdomen: Soft, gravid, appropriate for gestational age.  Pain/Pressure: Present     Pelvic: Cervical exam deferred        Extremities: Normal range of motion.  Edema: None  Mental Status: Normal mood and affect. Normal behavior. Normal judgment and thought content.      12/04/2023    9:05 AM 08/06/2023   10:19 AM 06/14/2023    9:49 AM  Depression screen PHQ 2/9  Decreased Interest 0 0 0  Down, Depressed, Hopeless 0 0 0  PHQ - 2 Score 0 0 0  Altered sleeping  0 0  Tired, decreased energy  1 0  Change in appetite  1 0  Feeling bad or failure about yourself   0 0  Trouble concentrating  0 0  Moving slowly or fidgety/restless  0 0   Suicidal thoughts  0 0  PHQ-9 Score  2  0      Data saved with a previous flowsheet row definition        08/06/2023   10:20 AM 06/14/2023    9:49 AM 08/10/2021    8:28 AM 06/07/2021    9:23 AM  GAD 7 : Generalized Anxiety Score  Nervous, Anxious, on Edge 0 0 0 0  Control/stop worrying 0 0 0 0  Worry too much - different things 0 0 0 0  Trouble relaxing 0 0 0 0  Restless 0 0 0 0  Easily annoyed or irritable 0 0 0 1  Afraid - awful might happen 0 0 0 0  Total GAD 7 Score 0 0 0 1    Assessment and Plan:  Pregnancy: G2P1001 at [redacted]w[redacted]d 1. Supervision of high risk pregnancy, antepartum (Primary) - Doing well, normal fetal movement  2. [redacted] weeks gestation of pregnancy - swabs  at next visit  3. Diet controlled gestational diabetes mellitus (GDM) in third trimester - Patient reports that blood sugars are WDL in morning and PP. Encouraged patient to bring BS log to next week's visit. MFM recommends weekly NST if blood sugars fall outside normal range. Will continue to monitor closely.  - follow-up growth scan is scheduled  4. History of cesarean delivery affecting pregnancy - Desires VBAC, consent has been signed  5. Herpes simplex type 2 (HSV-2) infection affecting pregnancy, antepartum - continue valtrex   suppression  Preterm labor symptoms and general obstetric precautions including but not limited to vaginal bleeding, contractions, leaking of fluid and fetal movement were reviewed in detail with the patient. Please refer to After Visit Summary for other counseling recommendations.   Return in about 1 week (around 01/15/2024) for LOB, IN-PERSON.  Future Appointments  Date Time Provider Department Center  01/15/2024  9:35 AM Wallace Joesph LABOR, PA [facility] None  01/22/2024  9:15 AM [facility] PROVIDER 1 [facility]  01/22/2024  9:30 AM [facility] US5 [facility]    Nat Dauer, NP  "

## 2024-01-08 NOTE — Progress Notes (Signed)
 Pt presents for rob. Pt has no questions or concerns at this time.

## 2024-01-09 NOTE — L&D Delivery Note (Signed)
 OB/GYN Faculty Practice Delivery Note  Danielle Lowery is a 24 y.o. G2P1001 s/p SVD at [redacted]w[redacted]d. She was admitted for IOL due to cholestasis.   ROM: 6h 1m with clear fluid GBS Status: Negative/-- (01/07 0139)    Labor Progress: Initial SVE: 1/50/-3. She then progressed to complete.   Delivery Date/Time: 01/21/24 at 0231 Delivery: Called to room and patient was complete and pushing. Head delivered LOA. No nuchal cord present. Shoulder and body delivered in usual fashion. Infant with spontaneous cry, placed on mother's abdomen, dried and stimulated. Cord clamped x 2 after 1-minute delay, and cut by FOB. Placenta delivered spontaneously with gentle cord traction. Fundus firm with massage and Pitocin . Labia, perineum, vagina, and cervix inspected inspected with small first degree perineal and labial lacerations noted.  Baby Weight: pending  Placenta: Sent to L&D Complications: None Lacerations: 1st degree lacerations repaired with multiple figure of 8 sutures until hemostatic EBL: 500 mL Analgesia: Epidural   Infant:  APGAR (1 MIN): 9  APGAR (5 MINS): 9

## 2024-01-14 ENCOUNTER — Inpatient Hospital Stay (HOSPITAL_COMMUNITY)
Admission: AD | Admit: 2024-01-14 | Discharge: 2024-01-14 | Disposition: A | Discharge status: Home / Self Care | Attending: Family Medicine | Admitting: Family Medicine

## 2024-01-14 ENCOUNTER — Other Ambulatory Visit: Payer: Self-pay

## 2024-01-14 ENCOUNTER — Inpatient Hospital Stay (HOSPITAL_COMMUNITY)

## 2024-01-14 ENCOUNTER — Inpatient Hospital Stay (HOSPITAL_COMMUNITY)
Admission: AD | Admit: 2024-01-14 | Discharge: 2024-01-14 | Disposition: A | Discharge status: Home / Self Care | Attending: Obstetrics & Gynecology | Admitting: Obstetrics & Gynecology

## 2024-01-14 ENCOUNTER — Encounter (HOSPITAL_COMMUNITY): Payer: Self-pay | Admitting: Obstetrics & Gynecology

## 2024-01-14 DIAGNOSIS — B009 Herpesviral infection, unspecified: Secondary | ICD-10-CM | POA: Diagnosis not present

## 2024-01-14 DIAGNOSIS — E669 Obesity, unspecified: Secondary | ICD-10-CM | POA: Diagnosis not present

## 2024-01-14 DIAGNOSIS — O99213 Obesity complicating pregnancy, third trimester: Secondary | ICD-10-CM | POA: Insufficient documentation

## 2024-01-14 DIAGNOSIS — Z3689 Encounter for other specified antenatal screening: Secondary | ICD-10-CM

## 2024-01-14 DIAGNOSIS — O34219 Maternal care for unspecified type scar from previous cesarean delivery: Secondary | ICD-10-CM | POA: Diagnosis not present

## 2024-01-14 DIAGNOSIS — O24419 Gestational diabetes mellitus in pregnancy, unspecified control: Secondary | ICD-10-CM | POA: Diagnosis not present

## 2024-01-14 DIAGNOSIS — O368130 Decreased fetal movements, third trimester, not applicable or unspecified: Secondary | ICD-10-CM

## 2024-01-14 DIAGNOSIS — O98513 Other viral diseases complicating pregnancy, third trimester: Secondary | ICD-10-CM

## 2024-01-14 DIAGNOSIS — O479 False labor, unspecified: Secondary | ICD-10-CM

## 2024-01-14 DIAGNOSIS — Z3A36 36 weeks gestation of pregnancy: Secondary | ICD-10-CM

## 2024-01-14 DIAGNOSIS — O4703 False labor before 37 completed weeks of gestation, third trimester: Secondary | ICD-10-CM | POA: Diagnosis present

## 2024-01-14 DIAGNOSIS — O24410 Gestational diabetes mellitus in pregnancy, diet controlled: Secondary | ICD-10-CM | POA: Diagnosis not present

## 2024-01-14 HISTORY — DX: Type 2 diabetes mellitus without complications: E11.9

## 2024-01-14 HISTORY — DX: Gestational diabetes mellitus in pregnancy, unspecified control: O24.419

## 2024-01-14 NOTE — Discharge Instructions (Signed)
 Reasons to return to MAU at [facility]: More than 36 weeks: You begin to have strong, frequent contractions 5 minutes apart or less, each last 1 minute, these have been going on for 1-2 hours, and you cannot walk or talk during them. Your water breaks.  Sometimes it is a big gush of fluid. However, many times it may it may be much more subtle. You should go to the hospital if you have a constant leakage of fluid from your vagina, enough to soak a pad when you are walking around.  You have vaginal bleeding.  It is normal to have a small amount of spotting if your cervix was checked. If you have bleeding requiring the use of a pad, go to the hospital. You don't feel your baby moving like normal.  If you think that you baby's movement is decreased, eat a snack and rest on your left side in a quiet room for one hour. If you have not felt the baby move more than 6 times in an hour GO TO THE HOSPITAL.

## 2024-01-14 NOTE — MAU Note (Addendum)
 Pt says UC's strong since 0200 Pain -6 1st baby was C/S- HSV outbreak. No outbreak during this preg- takes Valtrex  daily. . Denies any S/S now . This baby TOLAC  [facility]- [facility] No VE Plan to collect GBS tomorrow's visit

## 2024-01-14 NOTE — MAU Provider Note (Signed)
 Patient was assessed for active labor and managed by nursing staff during this encounter. I have reviewed the chart and agree with the documentation and plan. I have also reviewed the NST for appropriate reactivity.  Fetal Tracing: Baseline: 120 Variability: moderate Accelerations: yes  Decelerations: none Toco: uterine irritability, occasional uterine contractions    Dilation: 2.5 Effacement (%): 50 Cervical Position: Posterior Station: -2, -3 Presentation: Vertex Exam by:: Nathanel List  Patient stable for discharge home with labor precautions.  Joesph Sear, PA-C  Center for [employer], [facility]

## 2024-01-14 NOTE — MAU Note (Signed)
 MAU Labor Evaluation RN Medical Screening Exam: RN Assessment:  .Danielle Lowery is a 24 y.o. at [redacted]w[redacted]d here in MAU for evaluation of labor. See RN triage note.   Pain Score: 6   Cervical exam:  Dilation: 2.5 Effacement (%): 50 Cervical Position: Posterior Station: -2, -3 Presentation: Vertex Exam by:: Nathanel List  Fetal Monitoring: Baseline Rate (A): 125 bpm Variability: Moderate Accelerations: 15 x 15 Decelerations: None Contraction Frequency (min): occasional  Vitals:   01/14/24 0653  BP: 125/74  Pulse: (!) 113  Resp: 12  Temp: 98.6 F (37 C)      Medical Decision Making & Provider Communication:  This RN has communicated with:  [email or web address]  Communicated with MAU provider SBAR report of labor evaluation. Also reviewed contraction pattern and that non-stress test is reactive.  Contraction Frequency (min): occasional has been documented. Pt reports contractions are not as strong or as frequent as they where at not.  not indicating active labor.  Patient denies any other complaints.  Based on this report provider has given order for discharge. A discharge order and diagnosis entered by a provider. Labor discharge precautions reviewed with patient and all questions answered. Patient verbalized understanding.   Plan of Care: Discharge home with strict labor precautions

## 2024-01-14 NOTE — Discharge Instructions (Signed)
 Come to the MAU (maternity admission unit) for 1) Strong contractions every 2-3 minutes for at least 1 hour that do not go away when you drink water or take a warm shower. These contractions will be so strong all you can do is breath through them 2) Vaginal bleeding- anything more than spotting 3) Loss of fluid like you broke your water 4) Decreased movement of your baby

## 2024-01-14 NOTE — MAU Provider Note (Signed)
 " History     CSN: [id number]  Arrival date and time: 01/14/24 1251   Event Date/Time   First Provider Initiated Contact with Patient 01/14/24 1510      Chief Complaint  Patient presents with   Contractions   Danielle Lowery is G2P1001 [redacted]w[redacted]d presenting with contractions and reports decreased fetal movement starting this morning with contractions. Pregnancy affected by GDM, prior C-section.  Denies bleeding. Denies acute abdominal pain.   + fetal movement, decreased intensity.  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 08/26/2021   HSV PCR positive  IGG negative, indicated primary outbreak diagnosed at [redacted] wks     Gestational diabetes    Herpes simplex virus (HSV) infection    Prediabetes in mother during pregnancy     Past Surgical History:  Procedure Laterality Date   CESAREAN SECTION N/A 10/16/2021   Procedure: CESAREAN SECTION;  Surgeon: Lola Donnice HERO, MD;  Location: [facility];  Service: Obstetrics;  Laterality: N/A;   DENTAL SURGERY  2015    Family History  Problem Relation Age of Onset   Diabetes Mother    Stroke Father    Diabetes Father    Diabetes Maternal Grandmother    Diabetes Maternal Grandfather    Diabetes Paternal Grandmother    Cancer Paternal Grandfather     Social History[1]  Allergies: Allergies[2]  No medications prior to admission.    Review of Systems  Constitutional:  Negative for chills and fever.  HENT:  Negative for congestion and sore throat.   Eyes:  Negative for pain and visual disturbance.  Respiratory:  Negative for cough, chest tightness and shortness of breath.   Cardiovascular:  Negative for  chest pain.  Gastrointestinal:  Negative for abdominal pain, diarrhea, nausea and vomiting.  Endocrine: Negative for cold intolerance and heat intolerance.  Genitourinary:  Negative for dysuria and flank pain.  Musculoskeletal:  Negative for back pain.  Skin:  Negative for rash.  Allergic/Immunologic: Negative for food allergies.  Neurological:  Negative for dizziness and light-headedness.  Psychiatric/Behavioral:  Negative for agitation.    Physical Exam   Blood pressure 136/80, pulse 83, temperature 97.9 F (36.6 C), temperature source Oral, resp. rate 19, last menstrual period 03/18/2023, SpO2 100%.  Physical Exam Vitals and nursing note reviewed.  Constitutional:      General: She is not in acute distress.    Appearance: She is well-developed.     Comments: Pregnant female  HENT:     Head: Normocephalic and atraumatic.  Eyes:     General: No scleral icterus.    Conjunctiva/sclera: Conjunctivae normal.  Cardiovascular:     Rate and Rhythm: Normal rate.  Pulmonary:     Effort: Pulmonary effort is normal.  Chest:     Chest wall: No tenderness.  Abdominal:     Palpations: Abdomen is soft.     Tenderness: There is no abdominal tenderness. There is no guarding or  rebound.     Comments: Gravid  Genitourinary:    Vagina: Normal.  Musculoskeletal:        General: Normal range of motion.     Cervical back: Normal range of motion and neck supple.  Skin:    General: Skin is warm and dry.     Findings: No rash.  Neurological:     Mental Status: She is alert and oriented to person, place, and time.     MAU Course  Procedures I reviewed the patient's fetal monitoring.  Baseline HR: 120 Variability:  moderate Accels:present Decels: none Contractions--> initially 5-7, moved to none A/P: Reactive NST  Reassured regarding fetal status.   MDM: moderate  This patient presents to the ED for concern of   Chief Complaint  Patient presents with   Contractions     These  complains involves an extensive number of treatment options, and is a complaint that carries with it a high risk of complications and morbidity.  The differential diagnosis for  1. Contractions INCLUDES preterm labor, threatened PTL, dehydration  2. 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  The pertinent results include:  No results found for this or any previous visit (from the past 24 hours).   Imaging Studies ordered:  BPP 8/8- reviewed images personally.  US  MFM Fetal BPP Wo Non Stress Result Date: 01/14/2024 ----------------------------------------------------------------------  OBSTETRICS REPORT                       (Signed Final 01/14/2024 07:25 pm) ---------------------------------------------------------------------- Patient Info  ID #:       [id number]                          D.O.B.:  April 12, 2000 (23 yrs)(F)  Name:       Danielle Lowery               Visit Date: 01/14/2024 02:52 pm ---------------------------------------------------------------------- Performed By  Attending:        Steffan Keys MD         Ref. Address:      [address]                                                              [place], [place]  [address]  Performed By:     Mardy Heller          [facility]  Referred By:      [facility] ---------------------------------------------------------------------- Orders  #  Description                           Code        Ordered By  1  US  MFM FETAL BPP WO NON               76819.01    [facility] Jaziah Goeller     STRESS ----------------------------------------------------------------------  #  Order #                     Accession #                Episode #  1  [id number]                   [id number]                 [id number] ---------------------------------------------------------------------- Indications  Decreased fetal movement                        O36.8190  Gestational diabetes in pregnancy, diet         O24.410  controlled  Obesity complicating pregnancy, third           O99.213  trimester  (BMI 35.43)  Herpes simplex virus (HSV)                      O98.519 B00.9  Low risk NIPS female - Neg AFP/Horizon  Encounter for other antenatal screening         Z36.2  follow-up  LR NIPS; neg AFP  Short interval between pregnancies, 3rd         O09.893  trimester  Previous cesarean delivery, antepartum          O34.219  [redacted] weeks gestation of pregnancy                 Z3A.36 ---------------------------------------------------------------------- Fetal Evaluation  Num Of Fetuses:          1  Fetal Heart Rate(bpm):   118  Cardiac Activity:        Observed  Presentation:            Cephalic  Placenta:                Fundal  P. Cord Insertion:       Visualized  Amniotic Fluid  AFI FV:      Within normal limits  AFI Sum(cm)     %Tile       Largest Pocket(cm)  16.8            63          5.7  RUQ(cm)       RLQ(cm)       LUQ(cm)        LLQ(cm)  5.7  5.7           1.5            3.9  Comment:    Stomach, bladder, and diaphragm noted.  ---------------------------------------------------------------------- Biophysical Evaluation  Amniotic F.V:   Within normal limits       F. Tone:         Observed  F. Movement:    Observed                   Score:           8/8  F. Breathing:   Observed ---------------------------------------------------------------------- OB History  Gravidity:    2         Term:   1  Living:       1 ---------------------------------------------------------------------- Gestational Age  LMP:           43w 1d        Date:  03/18/23                  EDD:   12/23/23  Best:          fontaine 2d     Det. By:  Early Ultrasound         EDD:   02/09/24                                      (06/28/23) ---------------------------------------------------------------------- Comments  This patient presented to the MAU due to decreased fetal  movements and contractions.  Sonographic findings  Single intrauterine pregnancy at 36w 2d.  Fetal cardiac activity: Observed.  Presentation: Cephalic.  Amniotic fluid: Within normal limits. AFI: 16.8 cm,  MVP: 5.7  cm.  Placenta: Fundal.  BPP: 8/8. ----------------------------------------------------------------------                  Steffan Keys, MD Electronically Signed Final Report   01/14/2024 07:25 pm ----------------------------------------------------------------------   MAU Course:  3:17 PM Feeling normal movement now and not having contractions.    After the interventions noted above, I reevaluated the patient and found that they have :improved  Dispostion: discharged   Assessment and Plan   1. Threatened preterm labor, third trimester   2. Decreased fetal movements in third trimester, single or unspecified fetus   3. [redacted] weeks gestation of pregnancy   4. NST (non-stress test) reactive     - Discharged stable condition to home - Reviewed in detail return precautions - Reviewed fetal movement expectations.  Continue routine care  Future Appointments  Date Time Provider Department  Center  01/22/2024  9:15 AM [facility] PROVIDER 1 [facility]  01/22/2024  9:30 AM [facility] US5 [facility]  01/24/2024 10:15 AM Zina Jerilynn LABOR, MD [facility] None    Suzen Octave [facility] 01/15/2024, 1:55 PM      [1]  Social History Tobacco Use   Smoking status: Never   Smokeless tobacco: Never  Vaping Use   Vaping status: Never Used  Substance Use Topics   Alcohol use: No   Drug use: No  [2] No Known Allergies  "

## 2024-01-14 NOTE — MAU Note (Signed)
 Danielle Lowery is a 23 y.o. at [redacted]w[redacted]d here in MAU reporting: she's having ctxs that are off and on.  Denies VB and LOF.  Reports +FM, less than usual.  LMP: 03/18/2023 Onset of complaint: today Pain score: 6 Vitals:   01/14/24 1326  BP: 120/77  Pulse: 79  Resp: 18  Temp: 97.9 F (36.6 C)  SpO2: 100%     FHT: 130 bpm  Lab orders placed from triage: None

## 2024-01-15 ENCOUNTER — Encounter: Payer: Self-pay | Admitting: Family Medicine

## 2024-01-15 ENCOUNTER — Other Ambulatory Visit: Payer: Self-pay

## 2024-01-15 ENCOUNTER — Other Ambulatory Visit: Payer: Self-pay | Admitting: Family Medicine

## 2024-01-15 ENCOUNTER — Ambulatory Visit (INDEPENDENT_AMBULATORY_CARE_PROVIDER_SITE_OTHER): Payer: Self-pay | Admitting: Family Medicine

## 2024-01-15 ENCOUNTER — Other Ambulatory Visit (HOSPITAL_COMMUNITY)
Admission: RE | Admit: 2024-01-15 | Discharge: 2024-01-15 | Disposition: A | Discharge status: Home / Self Care | Source: Ambulatory Visit | Attending: Family Medicine | Admitting: Family Medicine

## 2024-01-15 VITALS — BP 121/84 | HR 96 | Wt 189.2 lb

## 2024-01-15 DIAGNOSIS — Z3A36 36 weeks gestation of pregnancy: Secondary | ICD-10-CM | POA: Insufficient documentation

## 2024-01-15 DIAGNOSIS — O0990 Supervision of high risk pregnancy, unspecified, unspecified trimester: Secondary | ICD-10-CM | POA: Diagnosis present

## 2024-01-15 DIAGNOSIS — O0993 Supervision of high risk pregnancy, unspecified, third trimester: Secondary | ICD-10-CM | POA: Diagnosis not present

## 2024-01-15 DIAGNOSIS — O34219 Maternal care for unspecified type scar from previous cesarean delivery: Secondary | ICD-10-CM | POA: Diagnosis not present

## 2024-01-15 DIAGNOSIS — O99210 Obesity complicating pregnancy, unspecified trimester: Secondary | ICD-10-CM

## 2024-01-15 DIAGNOSIS — O24410 Gestational diabetes mellitus in pregnancy, diet controlled: Secondary | ICD-10-CM

## 2024-01-15 DIAGNOSIS — L2989 Other pruritus: Secondary | ICD-10-CM

## 2024-01-15 DIAGNOSIS — B009 Herpesviral infection, unspecified: Secondary | ICD-10-CM

## 2024-01-15 DIAGNOSIS — O98513 Other viral diseases complicating pregnancy, third trimester: Secondary | ICD-10-CM

## 2024-01-15 DIAGNOSIS — O26643 Intrahepatic cholestasis of pregnancy, third trimester: Secondary | ICD-10-CM | POA: Insufficient documentation

## 2024-01-15 DIAGNOSIS — O99213 Obesity complicating pregnancy, third trimester: Secondary | ICD-10-CM

## 2024-01-15 MED ORDER — URSODIOL 500 MG PO TABS: 500.0000 mg | ORAL_TABLET | Freq: Two times a day (BID) | ORAL | 3 refills | Status: DC

## 2024-01-15 NOTE — Progress Notes (Signed)
 Pt presents for rob. Pt is having some very light discharge. No other questions or concerns at this time.

## 2024-01-15 NOTE — Progress Notes (Addendum)
 "  PRENATAL VISIT NOTE  Subjective:  Danielle Lowery is a 24 y.o. G2P1001 at [redacted]w[redacted]d being seen today for ongoing prenatal care.  She is currently monitored for the following issues for this high-risk pregnancy and has HSV-2 infection complicating pregnancy; Supervision of high risk pregnancy, antepartum; History of cesarean delivery affecting pregnancy; Obesity affecting pregnancy, antepartum; and Gestational diabetes, diet controlled on their problem list.  Patient reports vaginal discharge.  She also reports her right palm has been itching since this morning. No itching of left hand or feet. Contractions: Irritability. Vag. Bleeding: None, Scant (very light brown).  Movement: Present. Denies leaking of fluid.   The following portions of the patient's history were reviewed and updated as appropriate: allergies, current medications, past family history, past medical history, past social history, past surgical history and problem list.   Objective:   Vitals:   01/15/24 1011  BP: 121/84  Pulse: 96  Weight: 189 lb 3.2 oz (85.8 kg)    Fetal Status:  Fetal Heart Rate (bpm): 140   Movement: Present    General: Alert, oriented and cooperative. Patient is in no acute distress.  Skin: Skin is warm and dry. No rash noted.   Cardiovascular: Normal heart rate noted  Respiratory: Normal respiratory effort, no problems with respiration noted  Abdomen: Soft, gravid, appropriate for gestational age.  Pain/Pressure: Present     Pelvic: Cervical exam deferred, checked in hospital yesterday        Extremities: Normal range of motion.  Edema: None  Mental Status: Normal mood and affect. Normal behavior. Normal judgment and thought content.      12/04/2023    9:05 AM 08/06/2023   10:19 AM 06/14/2023    9:49 AM  Depression screen PHQ 2/9  Decreased Interest 0 0 0  Down, Depressed, Hopeless 0 0 0  PHQ - 2 Score 0 0 0  Altered sleeping  0 0  Tired, decreased energy  1 0  Change in appetite  1 0   Feeling bad or failure about yourself   0 0  Trouble concentrating  0 0  Moving slowly or fidgety/restless  0 0  Suicidal thoughts  0 0  PHQ-9 Score  2  0      Data saved with a previous flowsheet row definition        08/06/2023   10:20 AM 06/14/2023    9:49 AM 08/10/2021    8:28 AM 06/07/2021    9:23 AM  GAD 7 : Generalized Anxiety Score  Nervous, Anxious, on Edge 0 0 0 0  Control/stop worrying 0 0 0 0  Worry too much - different things 0 0 0 0  Trouble relaxing 0 0 0 0  Restless 0 0 0 0  Easily annoyed or irritable 0 0 0 1  Afraid - awful might happen 0 0 0 0  Total GAD 7 Score 0 0 0 1    Assessment and Plan:  Pregnancy: G2P1001 at [redacted]w[redacted]d 1. Supervision of high risk pregnancy, antepartum (Primary) Prenatal course reviewed BP, HR, FHR within normal limits Feeling regular FM   2. Obesity affecting pregnancy, antepartum, unspecified obesity type  3. 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 History of cesarean delivery affecting pregnancy Desired VBAC  5. Diet controlled gestational diabetes mellitus (GDM) in third trimester Current regimen: diet-controlled CBG review: did not bring lot to review but reports numbers have been at goal, will send picture  when she gets home Regimen changes: none Growth US : 01/22/2024 Antenatal monitoring: Continue weekly testing with NST if blood glucose levels not within normal limits   6. [redacted] weeks gestation of pregnancy GBS and GC/CT collected Weekly visits until delivery  7. Unilateral pruritus of right palm Checking bile acids for cholestasis of pregnancy Hospital precautions if itching worsens or spreads to other hand, feet, elsewhere on her body  Given nausea and new unilateral itching, recommend starting ursodiol . Discussed with Dr. Ozan, agrees with starting ursodiol . Recommends NST later this week, will schedule. Based on bile acid results,  will discuss IOL with attending at that time and schedule.  Preterm labor symptoms and general obstetric precautions including but not limited to vaginal bleeding, contractions, leaking of fluid and fetal movement were reviewed in detail with the patient. Please refer to After Visit Summary for other counseling recommendations.   Return in about 1 week (around 01/22/2024) for [facility], needs weekly NST .  Future Appointments  Date Time Provider Department Center  01/22/2024  9:15 AM [facility] PROVIDER 1 [facility]  01/22/2024  9:30 AM [facility] US5 [facility]  01/24/2024 10:15 AM Zina Jerilynn LABOR, MD [facility] None    Joesph LABOR Sear, PA  "

## 2024-01-15 NOTE — Patient Instructions (Addendum)
 Please send a picture on MyChart of your glucose log when you get home!  Please send me a message right away if your itching worsens or spreads, or if your nausea worsens!

## 2024-01-16 ENCOUNTER — Ambulatory Visit: Payer: Self-pay | Admitting: Family Medicine

## 2024-01-16 DIAGNOSIS — O0990 Supervision of high risk pregnancy, unspecified, unspecified trimester: Secondary | ICD-10-CM

## 2024-01-16 LAB — CERVICOVAGINAL ANCILLARY ONLY
Bacterial Vaginitis (gardnerella): POSITIVE — AB
Candida Glabrata: NEGATIVE
Candida Vaginitis: NEGATIVE
Chlamydia: NEGATIVE
Comment: NEGATIVE
Comment: NEGATIVE
Comment: NEGATIVE
Comment: NEGATIVE
Comment: NEGATIVE
Comment: NORMAL
Neisseria Gonorrhea: NEGATIVE
Trichomonas: NEGATIVE

## 2024-01-16 LAB — BILE ACIDS, TOTAL

## 2024-01-16 LAB — COMPREHENSIVE METABOLIC PANEL WITH GFR
ALT: 24 IU/L (ref 0–32)
AST: 41 IU/L — ABNORMAL HIGH (ref 0–40)
Albumin: 3.7 g/dL — ABNORMAL LOW (ref 4.0–5.0)
Alkaline Phosphatase: 192 IU/L — ABNORMAL HIGH (ref 41–116)
BUN/Creatinine Ratio: 16 (ref 9–23)
BUN: 8 mg/dL (ref 6–20)
Bilirubin Total: 1.8 mg/dL — ABNORMAL HIGH (ref 0.0–1.2)
CO2: 22 mmol/L (ref 20–29)
Calcium: 9.1 mg/dL (ref 8.7–10.2)
Chloride: 104 mmol/L (ref 96–106)
Creatinine, Ser: 0.49 mg/dL — ABNORMAL LOW (ref 0.57–1.00)
Globulin, Total: 2.8 g/dL (ref 1.5–4.5)
Glucose: 81 mg/dL (ref 70–99)
Potassium: 4.2 mmol/L (ref 3.5–5.2)
Sodium: 141 mmol/L (ref 134–144)
Total Protein: 6.5 g/dL (ref 6.0–8.5)
eGFR: 134 mL/min/1.73

## 2024-01-17 ENCOUNTER — Other Ambulatory Visit: Payer: Self-pay

## 2024-01-17 ENCOUNTER — Telehealth: Payer: Self-pay

## 2024-01-17 ENCOUNTER — Ambulatory Visit: Payer: Self-pay

## 2024-01-17 VITALS — BP 122/72 | HR 83 | Wt 192.5 lb

## 2024-01-17 DIAGNOSIS — O26643 Intrahepatic cholestasis of pregnancy, third trimester: Secondary | ICD-10-CM

## 2024-01-17 DIAGNOSIS — O24410 Gestational diabetes mellitus in pregnancy, diet controlled: Secondary | ICD-10-CM | POA: Diagnosis not present

## 2024-01-17 DIAGNOSIS — Z3A36 36 weeks gestation of pregnancy: Secondary | ICD-10-CM

## 2024-01-17 DIAGNOSIS — O0990 Supervision of high risk pregnancy, unspecified, unspecified trimester: Secondary | ICD-10-CM

## 2024-01-17 DIAGNOSIS — O99213 Obesity complicating pregnancy, third trimester: Secondary | ICD-10-CM

## 2024-01-17 DIAGNOSIS — O0993 Supervision of high risk pregnancy, unspecified, third trimester: Secondary | ICD-10-CM

## 2024-01-17 LAB — BILE ACIDS, TOTAL: Bile Acids Total: 59.7 umol/L (ref 0.0–10.0)

## 2024-01-17 NOTE — Progress Notes (Signed)
 Pt is in the office for NST only Reports good fetal movement, denies any pain NST is reactive today, reviewed by Dr. Alger in office

## 2024-01-17 NOTE — Telephone Encounter (Signed)
 Bile Acids 59.7 umol/L  Test on 01/15/24 Result received 01/17/24 via [facility], scanned into pt record.

## 2024-01-19 ENCOUNTER — Inpatient Hospital Stay (HOSPITAL_COMMUNITY)

## 2024-01-19 LAB — CULTURE, BETA STREP (GROUP B ONLY): Strep Gp B Culture: NEGATIVE

## 2024-01-20 ENCOUNTER — Encounter (HOSPITAL_COMMUNITY): Payer: Self-pay | Admitting: Obstetrics and Gynecology

## 2024-01-20 ENCOUNTER — Inpatient Hospital Stay (HOSPITAL_COMMUNITY): Admitting: Anesthesiology

## 2024-01-20 ENCOUNTER — Inpatient Hospital Stay (HOSPITAL_COMMUNITY)
Admission: AD | Admit: 2024-01-20 | Discharge: 2024-01-22 | DRG: 806 | Disposition: A | Discharge status: Home / Self Care | Attending: Obstetrics and Gynecology | Admitting: Obstetrics and Gynecology

## 2024-01-20 ENCOUNTER — Other Ambulatory Visit: Payer: Self-pay

## 2024-01-20 DIAGNOSIS — O9832 Other infections with a predominantly sexual mode of transmission complicating childbirth: Secondary | ICD-10-CM | POA: Diagnosis present

## 2024-01-20 DIAGNOSIS — O34219 Maternal care for unspecified type scar from previous cesarean delivery: Secondary | ICD-10-CM | POA: Diagnosis present

## 2024-01-20 DIAGNOSIS — O26643 Intrahepatic cholestasis of pregnancy, third trimester: Secondary | ICD-10-CM | POA: Diagnosis present

## 2024-01-20 DIAGNOSIS — O9902 Anemia complicating childbirth: Secondary | ICD-10-CM | POA: Diagnosis present

## 2024-01-20 DIAGNOSIS — Z3A37 37 weeks gestation of pregnancy: Secondary | ICD-10-CM | POA: Diagnosis not present

## 2024-01-20 DIAGNOSIS — A6000 Herpesviral infection of urogenital system, unspecified: Secondary | ICD-10-CM | POA: Diagnosis present

## 2024-01-20 DIAGNOSIS — Z833 Family history of diabetes mellitus: Secondary | ICD-10-CM

## 2024-01-20 DIAGNOSIS — O24420 Gestational diabetes mellitus in childbirth, diet controlled: Secondary | ICD-10-CM | POA: Diagnosis present

## 2024-01-20 LAB — COMPREHENSIVE METABOLIC PANEL WITH GFR
ALT: 14 U/L (ref 0–44)
AST: 20 U/L (ref 15–41)
Albumin: 3.4 g/dL — ABNORMAL LOW (ref 3.5–5.0)
Alkaline Phosphatase: 176 U/L — ABNORMAL HIGH (ref 38–126)
Anion gap: 13 (ref 5–15)
BUN: 10 mg/dL (ref 6–20)
CO2: 19 mmol/L — ABNORMAL LOW (ref 22–32)
Calcium: 8.5 mg/dL — ABNORMAL LOW (ref 8.9–10.3)
Chloride: 106 mmol/L (ref 98–111)
Creatinine, Ser: 0.41 mg/dL — ABNORMAL LOW (ref 0.44–1.00)
GFR, Estimated: >60 mL/min
Glucose, Bld: 80 mg/dL (ref 70–99)
Potassium: 3.7 mmol/L (ref 3.5–5.1)
Sodium: 137 mmol/L (ref 135–145)
Total Bilirubin: 0.3 mg/dL (ref 0.0–1.2)
Total Protein: 6.3 g/dL — ABNORMAL LOW (ref 6.5–8.1)

## 2024-01-20 LAB — GLUCOSE, CAPILLARY
Glucose-Capillary: 86 mg/dL (ref 70–99)
Glucose-Capillary: 85 mg/dL (ref 70–99)
Glucose-Capillary: 76 mg/dL (ref 70–99)
Glucose-Capillary: 69 mg/dL — ABNORMAL LOW (ref 70–99)
Glucose-Capillary: 73 mg/dL (ref 70–99)

## 2024-01-20 LAB — TYPE AND SCREEN
ABO/RH(D): O POS
Antibody Screen: NEGATIVE

## 2024-01-20 LAB — CBC
HCT: 32.8 % — ABNORMAL LOW (ref 36.0–46.0)
Hemoglobin: 10.4 g/dL — ABNORMAL LOW (ref 12.0–15.0)
MCH: 25.7 pg — ABNORMAL LOW (ref 26.0–34.0)
MCHC: 31.7 g/dL (ref 30.0–36.0)
MCV: 81.2 fL (ref 80.0–100.0)
Platelets: 303 K/uL (ref 150–400)
RBC: 4.04 MIL/uL (ref 3.87–5.11)
RDW: 14.8 % (ref 11.5–15.5)
WBC: 9 K/uL (ref 4.0–10.5)
nRBC: 0 % (ref 0.0–0.2)

## 2024-01-20 LAB — SYPHILIS: RPR W/REFLEX TO RPR TITER AND TREPONEMAL ANTIBODIES, TRADITIONAL SCREENING AND DIAGNOSIS ALGORITHM: RPR Ser Ql: NONREACTIVE

## 2024-01-20 MED ORDER — VALACYCLOVIR HCL 500 MG PO TABS
1000.0000 mg | ORAL_TABLET | Freq: Every day | ORAL | Status: DC
  Administered 2024-01-20: 1000 mg via ORAL
  Filled 2024-01-20: qty 2

## 2024-01-20 MED ORDER — EPHEDRINE 5 MG/ML INJ: 10.0000 mg | INTRAVENOUS | Status: DC | PRN

## 2024-01-20 MED ORDER — OXYTOCIN-SODIUM CHLORIDE 30-0.9 UT/500ML-% IV SOLN
1.0000 m[IU]/min | INTRAVENOUS | Status: DC
  Administered 2024-01-20: 2 m[IU]/min via INTRAVENOUS
  Filled 2024-01-20: qty 500

## 2024-01-20 MED ORDER — OXYCODONE-ACETAMINOPHEN 5-325 MG PO TABS: 1.0000 | ORAL_TABLET | ORAL | Status: DC | PRN

## 2024-01-20 MED ORDER — URSODIOL 300 MG PO CAPS
600.0000 mg | ORAL_CAPSULE | Freq: Two times a day (BID) | ORAL | Status: DC
  Administered 2024-01-20 (×2): 600 mg via ORAL
  Filled 2024-01-20 (×5): qty 2

## 2024-01-20 MED ORDER — LIDOCAINE HCL (PF) 1 % IJ SOLN
INTRAMUSCULAR | Status: DC | PRN
  Administered 2024-01-20: 8 mL via EPIDURAL

## 2024-01-20 MED ORDER — LIDOCAINE HCL (PF) 1 % IJ SOLN: 30.0000 mL | INTRAMUSCULAR | Status: DC | PRN

## 2024-01-20 MED ORDER — LACTATED RINGERS IV SOLN: INTRAVENOUS | Status: DC

## 2024-01-20 MED ORDER — OXYTOCIN-SODIUM CHLORIDE 30-0.9 UT/500ML-% IV SOLN
2.5000 [IU]/h | INTRAVENOUS | Status: DC
  Filled 2024-01-20: qty 500

## 2024-01-20 MED ORDER — PHENYLEPHRINE 80 MCG/ML (10ML) SYRINGE FOR IV PUSH (FOR BLOOD PRESSURE SUPPORT): 80.0000 ug | PREFILLED_SYRINGE | INTRAVENOUS | Status: DC | PRN

## 2024-01-20 MED ORDER — LACTATED RINGERS IV SOLN: 500.0000 mL | INTRAVENOUS | Status: DC | PRN

## 2024-01-20 MED ORDER — TERBUTALINE SULFATE 1 MG/ML IJ SOLN: 0.2500 mg | Freq: Once | INTRAMUSCULAR | Status: DC | PRN

## 2024-01-20 MED ORDER — FENTANYL CITRATE (PF) 100 MCG/2ML IJ SOLN
50.0000 ug | INTRAMUSCULAR | Status: DC | PRN
  Administered 2024-01-20 (×2): 100 ug via INTRAVENOUS
  Administered 2024-01-20: 50 ug via INTRAVENOUS
  Administered 2024-01-20: 100 ug via INTRAVENOUS
  Filled 2024-01-20 (×4): qty 2

## 2024-01-20 MED ORDER — ACETAMINOPHEN 325 MG PO TABS
650.0000 mg | ORAL_TABLET | ORAL | Status: DC | PRN
  Administered 2024-01-21: 650 mg via ORAL
  Filled 2024-01-20: qty 2

## 2024-01-20 MED ORDER — OXYTOCIN BOLUS FROM INFUSION
333.0000 mL | Freq: Once | INTRAVENOUS | Status: AC
  Administered 2024-01-21: 333 mL via INTRAVENOUS

## 2024-01-20 MED ORDER — ONDANSETRON HCL 4 MG/2ML IJ SOLN: 4.0000 mg | Freq: Four times a day (QID) | INTRAMUSCULAR | Status: DC | PRN

## 2024-01-20 MED ORDER — SOD CITRATE-CITRIC ACID 500-334 MG/5ML PO SOLN: 30.0000 mL | ORAL | Status: DC | PRN

## 2024-01-20 MED ORDER — DIPHENHYDRAMINE HCL 50 MG/ML IJ SOLN: 12.5000 mg | INTRAMUSCULAR | Status: DC | PRN

## 2024-01-20 MED ORDER — FENTANYL-BUPIVACAINE-NACL 0.5-0.125-0.9 MG/250ML-% EP SOLN
12.0000 mL/h | EPIDURAL | Status: DC | PRN
  Administered 2024-01-20: 12 mL/h via EPIDURAL
  Filled 2024-01-20: qty 250

## 2024-01-20 MED ORDER — LACTATED RINGERS IV SOLN: 500.0000 mL | Freq: Once | INTRAVENOUS | Status: DC

## 2024-01-20 MED ORDER — OXYCODONE-ACETAMINOPHEN 5-325 MG PO TABS: 2.0000 | ORAL_TABLET | ORAL | Status: DC | PRN

## 2024-01-20 MED ORDER — PHENYLEPHRINE 80 MCG/ML (10ML) SYRINGE FOR IV PUSH (FOR BLOOD PRESSURE SUPPORT)
80.0000 ug | PREFILLED_SYRINGE | INTRAVENOUS | Status: DC | PRN
  Filled 2024-01-20: qty 10

## 2024-01-20 NOTE — Progress Notes (Addendum)
 "  Danielle Lowery is a 24 y.o. G2P1001 at [redacted]w[redacted]d.  Subjective: Patient having more uncomfortable ctx. FB still in place. Currently on 6mu Pit  Objective: BP 132/72 (BP Location: Right Arm)   Pulse 81   Temp 97.6 F (36.4 C) (Oral)   Resp 18   Ht 4' 11 (1.499 m)   Wt 87.8 kg   LMP 03/18/2023 (Exact Date)   SpO2 100%   BMI 39.08 kg/m    FHT:  FHR: 125 bpm, variability: Moderate,  accelerations:  15x15,  decelerations:  None UC:   Q 1-4 minutes, Regular Dilation: 4 Effacement (%): 90 Station: -2 Presentation: Vertex Exam by:: Arvin Slicker, RN (SCE after note made)   Labs: Results for orders placed or performed during the hospital encounter of 01/20/24 (from the past 24 hours)  Type and screen [facility]     Status: None   Collection Time: 01/20/24  2:22 AM  Result Value Ref Range   ABO/RH(D) O POS    Antibody Screen NEG    Sample Expiration      01/23/2024,2359 Performed at [facility], [address] [place], [place] [address]   RPR     Status: None   Collection Time: 01/20/24  2:22 AM  Result Value Ref Range   RPR Ser Ql NON REACTIVE NON REACTIVE  CBC     Status: Abnormal   Collection Time: 01/20/24  3:25 AM  Result Value Ref Range   WBC 9.0 4.0 - 10.5 K/uL   RBC 4.04 3.87 - 5.11 MIL/uL   Hemoglobin 10.4 (L) 12.0 - 15.0 g/dL   HCT 67.1 (L) 63.9 - 53.9 %   MCV 81.2 80.0 - 100.0 fL   MCH 25.7 (L) 26.0 - 34.0 pg   MCHC 31.7 30.0 - 36.0 g/dL   RDW 85.1 88.4 - 84.4 %   Platelets 303 150 - 400 K/uL   nRBC 0.0 0.0 - 0.2 %  Comprehensive metabolic panel with GFR     Status: Abnormal   Collection Time: 01/20/24  3:25 AM  Result Value Ref Range   Sodium 137 135 - 145 mmol/L   Potassium 3.7 3.5 - 5.1 mmol/L   Chloride 106 98 - 111 mmol/L   CO2 19 (L) 22 - 32 mmol/L   Glucose, Bld 80 70 - 99 mg/dL   BUN 10 6 - 20 mg/dL   Creatinine, Ser 9.58 (L) 0.44 - 1.00 mg/dL   Calcium 8.5 (L) 8.9 - 10.3 mg/dL   Total Protein 6.3 (L) 6.5 - 8.1  g/dL   Albumin 3.4 (L) 3.5 - 5.0 g/dL   AST 20 15 - 41 U/L   ALT 14 0 - 44 U/L   Alkaline Phosphatase 176 (H) 38 - 126 U/L   Total Bilirubin 0.3 0.0 - 1.2 mg/dL   GFR, Estimated >39 >39 mL/min   Anion gap 13 5 - 15  Glucose, capillary     Status: None   Collection Time: 01/20/24  3:52 AM  Result Value Ref Range   Glucose-Capillary 86 70 - 99 mg/dL  Glucose, capillary     Status: None   Collection Time: 01/20/24  7:21 AM  Result Value Ref Range   Glucose-Capillary 85 70 - 99 mg/dL  Glucose, capillary     Status: None   Collection Time: 01/20/24 11:59 AM  Result Value Ref Range   Glucose-Capillary 76 70 - 99 mg/dL  Glucose, capillary     Status: Abnormal  Collection Time: 01/20/24  4:04 PM  Result Value Ref Range   Glucose-Capillary 69 (L) 70 - 99 mg/dL    Assessment / Plan: [redacted]w[redacted]d week IUP ROM x rupture date or rupture time have not been documented Labor: Plan to continue titrating Pitocin  to achieve adequate labor progress as tolerate by FHR tracing. Will perform cervical check if clinically indicated.  Fetal Wellbeing:  Category 1 Pain Control:  None Anticipated MOD:  Hopeful for a vaginal delivery.   Drumgoole, Breanna L, Student-MidWife 01/20/2024 6:53 PM  Attestation of Supervision of Student:  I confirm that I have verified the information documented in the nurse midwife students note and that I have also personally supervised the history, physical exam and all medical decision making activities.  I have verified that all services and findings are accurately documented in this student's note; and I agree with management and plan as outlined in the documentation. I have also made any necessary editorial changes.  Camie DELENA Rote, CNM [facility] [employer], [facility] 01/20/2024 7:21 PM    "

## 2024-01-20 NOTE — Progress Notes (Addendum)
 Patient ID: Danielle Lowery, female   DOB: [birth year], 24 y.o.   MRN: [id number] Danielle Lowery is a 24 y.o. G2P1001 at [redacted]w[redacted]d.  Subjective: CNM and SNM to bedside to meet patient. Danielle Lowery is accompanied by her partner, and mother. She is starting to feel more cramping. Discussed FB placement, patient agreeable.   Objective: BP 126/74 (BP Location: Right Arm)   Pulse 92   Temp 98.6 F (37 C) (Axillary)   Resp 17   Ht 4' 11 (1.499 m)   Wt 87.8 kg   LMP 03/18/2023 (Exact Date)   SpO2 100%   BMI 39.08 kg/m    FHT:  FHR: 130 bpm, variability: Moderate,  accelerations:  15x15,  decelerations:  None UC:   Q 2-6 minutes, Irregular Dilation: 1 (Via speculum) Presentation: Vertex (Confirmed via US ) Exam by:: MD Cashion   Labs: Results for orders placed or performed during the hospital encounter of 01/20/24 (from the past 24 hours)  Type and screen [facility]     Status: None   Collection Time: 01/20/24  2:22 AM  Result Value Ref Range   ABO/RH(D) O POS    Antibody Screen NEG    Sample Expiration      01/23/2024,2359 Performed at [facility], [address] [place], [place] [address]   RPR     Status: None   Collection Time: 01/20/24  2:22 AM  Result Value Ref Range   RPR Ser Ql NON REACTIVE NON REACTIVE  CBC     Status: Abnormal   Collection Time: 01/20/24  3:25 AM  Result Value Ref Range   WBC 9.0 4.0 - 10.5 K/uL   RBC 4.04 3.87 - 5.11 MIL/uL   Hemoglobin 10.4 (L) 12.0 - 15.0 g/dL   HCT 67.1 (L) 63.9 - 53.9 %   MCV 81.2 80.0 - 100.0 fL   MCH 25.7 (L) 26.0 - 34.0 pg   MCHC 31.7 30.0 - 36.0 g/dL   RDW 85.1 88.4 - 84.4 %   Platelets 303 150 - 400 K/uL   nRBC 0.0 0.0 - 0.2 %  Comprehensive metabolic panel with GFR     Status: Abnormal   Collection Time: 01/20/24  3:25 AM  Result Value Ref Range   Sodium 137 135 - 145 mmol/L   Potassium 3.7 3.5 - 5.1 mmol/L   Chloride 106 98 - 111 mmol/L   CO2 19 (L) 22 - 32 mmol/L   Glucose, Bld 80 70 -  99 mg/dL   BUN 10 6 - 20 mg/dL   Creatinine, Ser 9.58 (L) 0.44 - 1.00 mg/dL   Calcium 8.5 (L) 8.9 - 10.3 mg/dL   Total Protein 6.3 (L) 6.5 - 8.1 g/dL   Albumin 3.4 (L) 3.5 - 5.0 g/dL   AST 20 15 - 41 U/L   ALT 14 0 - 44 U/L   Alkaline Phosphatase 176 (H) 38 - 126 U/L   Total Bilirubin 0.3 0.0 - 1.2 mg/dL   GFR, Estimated >39 >39 mL/min   Anion gap 13 5 - 15  Glucose, capillary     Status: None   Collection Time: 01/20/24  3:52 AM  Result Value Ref Range   Glucose-Capillary 86 70 - 99 mg/dL  Glucose, capillary     Status: None   Collection Time: 01/20/24  7:21 AM  Result Value Ref Range   Glucose-Capillary 85 70 - 99 mg/dL  Glucose, capillary     Status: None   Collection Time: 01/20/24 11:59  AM  Result Value Ref Range   Glucose-Capillary 76 70 - 99 mg/dL    Assessment / Plan: [redacted]w[redacted]d week IUP Membranes intact Labor: Currently on 6mu Pitocin . Plan to keep Pitocin  at 6.  CNM to patient bedside perform SVE. Patient 1cm/thick/balottable following FB. CNM discussed reattempt, with a Cooks cath. Reviewed risks and benefits with patient and FOB. Patient desires to proceed with new FB attempt. FHT Cat 1 prior to insert.  FB placed by CNM using speculum with 40mL fluid added. Patient and fetus tolerated well and FHT remained Cat 1.      Fetal Wellbeing:  Category 1 Pain Control:  N/A Anticipated MOD:  Vaginal  Drumgoole, Breanna L, Student-MidWife 01/20/2024 12:43 PM  Attestation of Supervision of Student:  I confirm that I have verified the information documented in the nurse midwife students note and that I have also personally supervised the history, physical exam and all medical decision making activities.  I have verified that all services and findings are accurately documented in this student's note; and I agree with management and plan as outlined in the documentation. I have also made any necessary editorial changes.  Danielle Lowery, CNM [facility] [employer],  [facility] 01/20/2024 1:09 PM

## 2024-01-20 NOTE — Progress Notes (Signed)
 Labor Progress Note Annamarie Yamaguchi is a 24 y.o. G2P1001 at [redacted]w[redacted]d presented for IOL 2/2 intrahepatic cholestasis of  of pregnancy.  S: Doing well. No complaints. Amendable to AROM and IUPC placement.  O:  BP 125/88   Pulse 98   Temp 97.6 F (36.4 C) (Oral)   Resp 18   Ht 4' 11 (1.499 m)   Wt 87.8 kg   LMP 03/18/2023 (Exact Date)   SpO2 98%   BMI 39.08 kg/m  EFM: 130/Moderate Variability/Accelerations (+),Decelerations (-)  CVE: Dilation: 5 Effacement (%): 70 Station: -1 Presentation: Vertex Exam by:: Cashion MD   A&P: 24 y.o. G2P1001 [redacted]w[redacted]d admitted for IOL 2/2 intrahepatic cholestasis of pregnancy.  #Labor: Progressing well. AROM performed with clear fluid at 2022. IUPC placed. CTX every 2-3 minutes. Patient on pitocin  will reassess in 4 hours.   #Pain: Managed with epidural  #FWB: Category I #GBS negative #Intrahepatic cholestatis of pregnancy: Bile acids 59. Ursodiol  started.  Eliezer Dickens, Medical Student 9:19 PM

## 2024-01-20 NOTE — Anesthesia Preprocedure Evaluation (Signed)
"                                    Anesthesia Evaluation  Patient identified by MRN, date of birth, ID band Patient awake    Reviewed: Allergy & Precautions, Patient's Chart, lab work & pertinent test results  Airway Mallampati: II  TM Distance: >3 FB Neck ROM: Full    Dental no notable dental hx.    Pulmonary neg pulmonary ROS   Pulmonary exam normal breath sounds clear to auscultation       Cardiovascular negative cardio ROS Normal cardiovascular exam Rhythm:Regular Rate:Normal     Neuro/Psych negative neurological ROS  negative psych ROS   GI/Hepatic negative GI ROS, Neg liver ROS,,,  Endo/Other  diabetes, Well Controlled, Gestational  BMI 39  Renal/GU negative Renal ROS  negative genitourinary   Musculoskeletal negative musculoskeletal ROS (+)    Abdominal  (+) + obese  Peds  Hematology  (+) Blood dyscrasia, anemia Hb 10.4, plt 303   Anesthesia Other Findings   Reproductive/Obstetrics (+) Pregnancy                              Anesthesia Physical Anesthesia Plan  ASA: 2  Anesthesia Plan: Epidural   Post-op Pain Management:    Induction:   PONV Risk Score and Plan: 2  Airway Management Planned: Natural Airway  Additional Equipment: None  Intra-op Plan:   Post-operative Plan:   Informed Consent: I have reviewed the patients History and Physical, chart, labs and discussed the procedure including the risks, benefits and alternatives for the proposed anesthesia with the patient or authorized representative who has indicated his/her understanding and acceptance.       Plan Discussed with:   Anesthesia Plan Comments:         Anesthesia Quick Evaluation  "

## 2024-01-20 NOTE — Anesthesia Procedure Notes (Signed)
 Epidural Patient location during procedure: OB Start time: 01/20/2024 7:36 PM End time: 01/20/2024 7:48 PM  Staffing Anesthesiologist: Merla Almarie HERO, DO Performed: anesthesiologist   Preanesthetic Checklist Completed: patient identified, IV checked, risks and benefits discussed, monitors and equipment checked, pre-op evaluation and timeout performed  Epidural Patient position: sitting Prep: DuraPrep and site prepped and draped Patient monitoring: continuous pulse ox, blood pressure, heart rate and cardiac monitor Approach: midline Location: L3-L4 Injection technique: LOR air  Needle:  Needle type: Tuohy  Needle gauge: 17 G Needle length: 9 cm Needle insertion depth: 9 cm Catheter type: closed end flexible Catheter size: 19 Gauge Catheter at skin depth: 14 cm Test dose: negative  Assessment Sensory level: T8 Events: blood not aspirated, no cerebrospinal fluid, injection not painful, no injection resistance, no paresthesia and negative IV test  Additional Notes Patient identified. Risks/Benefits/Options discussed with patient including but not limited to bleeding, infection, nerve damage, paralysis, failed block, incomplete pain control, headache, blood pressure changes, nausea, vomiting, reactions to medication both or allergic, itching and postpartum back pain. Confirmed with bedside nurse the patient's most recent platelet count. Confirmed with patient that they are not currently taking any anticoagulation, have any bleeding history or any family history of bleeding disorders. Patient expressed understanding and wished to proceed. All questions were answered. Sterile technique was used throughout the entire procedure. Please see nursing notes for vital signs. Test dose was given through epidural catheter and negative prior to continuing to dose epidural or start infusion. Warning signs of high block given to the patient including shortness of breath, tingling/numbness in  hands, complete motor block, or any concerning symptoms with instructions to call for help. Patient was given instructions on fall risk and not to get out of bed. All questions and concerns addressed with instructions to call with any issues or inadequate analgesia.  Reason for block:procedure for pain

## 2024-01-20 NOTE — H&P (Signed)
 OBSTETRIC ADMISSION HISTORY AND PHYSICAL  Danielle Lowery is a 24 y.o. female G2P1001 with IUP at [redacted]w[redacted]d (dated by [redacted]w[redacted]d US , Estimated Date of Delivery: 02/09/24) presenting for IOL for cholestasis in pregnancy.   She reports +FMs, No LOF, no VB, no blurry vision, headaches or peripheral edema, and RUQ pain.    She plans on breast and formula feeding. She request undecided for birth control.  She received her prenatal care at 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 08/26/2021   HSV PCR positive  IGG negative, indicated primary outbreak diagnosed at [redacted] wks     Gestational diabetes    Herpes simplex virus (HSV) infection    Prediabetes in mother during pregnancy     Past Surgical History: Past Surgical History:  Procedure Laterality Date   CESAREAN SECTION N/A 10/16/2021   Procedure: CESAREAN SECTION;  Surgeon: Lola Donnice HERO, MD;  Location: [facility] LD ORS;  Service: Obstetrics;  Laterality: N/A;   DENTAL SURGERY  2015    Obstetrical History: OB History     Gravida  2   Para  1   Term  1   Preterm  0   AB  0   Living  1      SAB  0   IAB  0   Ectopic  0   Multiple  0   Live Births  1           Social History Social History   Socioeconomic History   Marital status: Single    Spouse name: Not on file   Number of children: Not on file   Years of education: Not on file   Highest education level: Not on file  Occupational History   Not on file  Tobacco Use   Smoking status: Never   Smokeless tobacco: Never  Vaping Use   Vaping status: Never Used  Substance and Sexual Activity   Alcohol use: No   Drug use: No   Sexual activity: Yes    Partners: Male  Other Topics Concern   Not on file  Social History Narrative   Not on file   Social Drivers  of Health   Tobacco Use: Low Risk (01/15/2024)   Patient History    Smoking Tobacco Use: Never    Smokeless Tobacco Use: Never    Passive Exposure: Not on file  Financial Resource Strain: Low Risk (05/20/2023)   Received from [facility]   Overall Financial Resource Strain (CARDIA)    Difficulty of Paying Living Expenses: Not hard at all  Food Insecurity: No Food Insecurity (12/04/2023)   Epic    Worried About Radiation Protection Practitioner of Food in the Last Year: Never true    Ran Out of Food in the Last Year: Never true  Transportation Needs: No Transportation Needs (05/20/2023)   Received from [facility] - Transportation    Lack of Transportation (Medical): No    Lack of Transportation (Non-Medical): No  Physical Activity: Inactive (05/20/2023)   Received from [facility]   Exercise Vital Sign    On average, how many days per week do you engage in moderate to strenuous exercise (like a brisk walk)?: 0 days    On average, how many minutes do you engage in exercise at this level?: 10 min  Stress:  No Stress Concern Present (05/20/2023)   Received from [facility] - Occupational Stress Questionnaire    Feeling of Stress : Not at all  Social Connections: Not on file  Depression (PHQ2-9): Low Risk (12/04/2023)   Depression (PHQ2-9)    PHQ-2 Score: 0  Alcohol Screen: Not on file  Housing: Low Risk (05/20/2023)   Received from [facility]    In the last 12 months, was there a time when you were not able to pay the mortgage or rent on time?: No    In the past 12 months, how many times have you moved where you were living?: 0    At any time in the past 12 months, were you homeless or living in a shelter (including now)?: No  Utilities: Not At Risk (05/20/2023)   Received from [facility] Utilities    Threatened with loss of utilities: No  Health Literacy: Not on file    Family History: Family History  Problem Relation Age  of Onset   Diabetes Mother    Stroke Father    Diabetes Father    Diabetes Maternal Grandmother    Diabetes Maternal Grandfather    Diabetes Paternal Grandmother    Cancer Paternal Grandfather     Allergies: Allergies[1]  Medications Prior to Admission  Medication Sig Dispense Refill Last Dose/Taking   Accu-Chek Softclix Lancets lancets Use as instructed 100 each 12    Blood Glucose Monitoring Suppl (ACCU-CHEK GUIDE) w/Device KIT 1 Device by Does not apply route 4 (four) times daily. 1 kit 0    Blood Pressure Monitoring (BLOOD PRESSURE KIT) DEVI 1 Device by Does not apply route once a week. 1 each 0    glucose blood (ACCU-CHEK GUIDE TEST) test strip 4 times daily 100 each 12    magnesium  oxide (MAG-OX) 400 MG tablet Take 1 tablet (400 mg total) by mouth daily. 90 tablet 1    Prenatal Vit-Fe Phos-FA-Omega (VITAFOL  GUMMIES) 3.33-0.333-34.8 MG CHEW Chew 3 tablets by mouth daily. 90 tablet 11    ursodiol  (ACTIGALL ) 500 MG tablet Take 1 tablet (500 mg total) by mouth 2 (two) times daily. 60 tablet 3    valACYclovir  (VALTREX ) 1000 MG tablet Take 1 tablet (1,000 mg total) by mouth 2 (two) times daily as needed. Take 1 tablet po daily after 31 weeks pregnancy until after delivery of baby. 30 tablet 11      Review of Systems  All systems reviewed and negative except as stated in HPI.  Last menstrual period 03/18/2023. General appearance: alert, cooperative, and appears stated age Lungs: breathing comfortably on room air Heart: regular rate Abdomen: soft, non-tender; gravid Extremities: no edema of bilateral lower extremities DTR's intact Presentation: cephalic Fetal monitoringBaseline: 130 bpm, Variability: Good {> 6 bpm), Accelerations: Reactive, and Decelerations: Absent Uterine activityNone     Prenatal labs: ABO, Rh: O/Positive/-- (07/29 1043) Antibody: Negative (07/29 1043) Rubella: 6.05 (07/29 1043) RPR: Non Reactive (11/17 0826)  HBsAg: Negative (07/29 1043)  HIV: Non  Reactive (11/17 0826)  GBS: Negative/-- (01/07 0139)  2 hr Glucola abnormal Genetic screening  low risk Anatomy US  appears normal Last US : At [redacted]w[redacted]d - cephalic presentation, EFW 2372g (66 %tile), AC 98%  Prenatal Transfer Tool  Maternal Diabetes: Yes:  Diabetes Type:  Diet controlled Genetic Screening: Normal Maternal Ultrasounds/Referrals: Normal Fetal Ultrasounds or other Referrals:  None Maternal Substance Abuse:  No Significant Maternal Medications:  None Significant Maternal  Lab Results:  Group B Strep negative Number of Prenatal Visits:greater than 3 verified prenatal visits Other Comments:  None  No results found for this or any previous visit (from the past 24 hours).  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 G2P1001 at [redacted]w[redacted]d here for IOL due to cholestasis of pregnancy.   #Labor: VBAC with previous c-section due to primary HSV lesions. Will start on pitocin  2x2. FB placed with speculum. 60cc in the balloon. Hold at 6mu/min while FB is in place.  #Pain: Per patient request, planning epidural #FWB: Cat I #ID:  GBS (-) #MOF: Formula #MOC: Undecided #Circ:  N/A #A1GDM: CBG's q4h in latent labor, q2h in active labor #HSV on valtrex : No lesions on speculum exam #TOLAC: Consent signed 12/23/23.  #Cholestasis in pregnancy: Bile acids 59.7 on 01/15/24, on Actigall .   Barkley Angles, MD OB Fellow, Faculty [facility] 01/20/2024 2:16 AM        [1] No Known Allergies

## 2024-01-21 LAB — GLUCOSE, CAPILLARY
Glucose-Capillary: 70 mg/dL (ref 70–99)
Glucose-Capillary: 77 mg/dL (ref 70–99)

## 2024-01-21 LAB — BILE ACIDS, TOTAL: Bile Acids Total: 15.8 umol/L — ABNORMAL HIGH (ref 0.0–10.0)

## 2024-01-21 MED ORDER — MEASLES, MUMPS & RUBELLA VAC ~~LOC~~ SUSR: 0.5000 mL | Freq: Once | SUBCUTANEOUS | Status: DC

## 2024-01-21 MED ORDER — OXYCODONE HCL 5 MG PO TABS: 5.0000 mg | ORAL_TABLET | Freq: Four times a day (QID) | ORAL | Status: DC | PRN

## 2024-01-21 MED ORDER — SIMETHICONE 80 MG PO CHEW: 80.0000 mg | CHEWABLE_TABLET | ORAL | Status: DC | PRN

## 2024-01-21 MED ORDER — SODIUM CHLORIDE 0.9% FLUSH: 3.0000 mL | INTRAVENOUS | Status: DC | PRN

## 2024-01-21 MED ORDER — ACETAMINOPHEN 325 MG PO TABS: 650.0000 mg | ORAL_TABLET | ORAL | Status: DC | PRN

## 2024-01-21 MED ORDER — IBUPROFEN 800 MG PO TABS
800.0000 mg | ORAL_TABLET | Freq: Three times a day (TID) | ORAL | Status: DC
  Administered 2024-01-21 – 2024-01-22 (×5): 800 mg via ORAL
  Filled 2024-01-21 (×5): qty 1

## 2024-01-21 MED ORDER — SODIUM CHLORIDE 0.9% FLUSH
3.0000 mL | Freq: Two times a day (BID) | INTRAVENOUS | Status: DC
  Administered 2024-01-21 – 2024-01-22 (×3): 3 mL via INTRAVENOUS

## 2024-01-21 MED ORDER — SENNOSIDES-DOCUSATE SODIUM 8.6-50 MG PO TABS
2.0000 | ORAL_TABLET | ORAL | Status: DC
  Administered 2024-01-21: 2 via ORAL
  Filled 2024-01-21 (×2): qty 2

## 2024-01-21 MED ORDER — ONDANSETRON HCL 4 MG PO TABS: 4.0000 mg | ORAL_TABLET | ORAL | Status: DC | PRN

## 2024-01-21 MED ORDER — TETANUS-DIPHTH-ACELL PERTUSSIS 5-2-15.5 LF-MCG/0.5 IM SUSP
0.5000 mL | Freq: Once | INTRAMUSCULAR | Status: AC
  Administered 2024-01-22: 0.5 mL via INTRAMUSCULAR
  Filled 2024-01-21: qty 0.5

## 2024-01-21 MED ORDER — COCONUT OIL OIL: 1.0000 | TOPICAL_OIL | Status: DC | PRN

## 2024-01-21 MED ORDER — DIBUCAINE (PERIANAL) 1 % EX OINT: 1.0000 | TOPICAL_OINTMENT | CUTANEOUS | Status: DC | PRN

## 2024-01-21 MED ORDER — WITCH HAZEL-GLYCERIN EX PADS: 1.0000 | MEDICATED_PAD | CUTANEOUS | Status: DC | PRN

## 2024-01-21 MED ORDER — PRENATAL MULTIVITAMIN CH
1.0000 | ORAL_TABLET | Freq: Every day | ORAL | Status: DC
  Administered 2024-01-21 – 2024-01-22 (×2): 1 via ORAL
  Filled 2024-01-21 (×2): qty 1

## 2024-01-21 MED ORDER — ONDANSETRON HCL 4 MG/2ML IJ SOLN: 4.0000 mg | INTRAMUSCULAR | Status: DC | PRN

## 2024-01-21 MED ORDER — BENZOCAINE-MENTHOL 20-0.5 % EX AERO
1.0000 | INHALATION_SPRAY | CUTANEOUS | Status: DC | PRN
  Administered 2024-01-21: 1 via TOPICAL
  Filled 2024-01-21: qty 56

## 2024-01-21 MED ORDER — SODIUM CHLORIDE 0.9 % IV SOLN: 250.0000 mL | INTRAVENOUS | Status: DC | PRN

## 2024-01-21 MED ORDER — DIPHENHYDRAMINE HCL 25 MG PO CAPS: 25.0000 mg | ORAL_CAPSULE | Freq: Four times a day (QID) | ORAL | Status: DC | PRN

## 2024-01-21 NOTE — Anesthesia Postprocedure Evaluation (Signed)
"   Anesthesia Post Note  Patient: Danielle Lowery  Procedure(s) Performed: AN AD HOC LABOR EPIDURAL     Patient location during evaluation: Mother Baby Anesthesia Type: Epidural Level of consciousness: awake and alert Pain management: pain level controlled Vital Signs Assessment: post-procedure vital signs reviewed and stable Respiratory status: spontaneous breathing, nonlabored ventilation and respiratory function stable Cardiovascular status: stable Postop Assessment: no headache, no backache and epidural receding Anesthetic complications: no   No notable events documented.  Last Vitals:  Vitals:   01/21/24 0904 01/21/24 1336  BP: 110/71 120/72  Pulse: 81 65  Resp: 16 18  Temp: 36.7 C 36.7 C  SpO2: 99% 100%    Last Pain:  Vitals:   01/21/24 1337  TempSrc:   PainSc: 0-No pain   Pain Goal: Patients Stated Pain Goal: 0 (01/20/24 1846)                 Jezreel Justiniano      "

## 2024-01-21 NOTE — Discharge Summary (Signed)
 "    Postpartum Discharge Summary  Date of Service updated***     Patient Name: Danielle Lowery DOB: 06/28/2000 MRN: [id number]  Date of admission: 01/20/2024 Delivery date:01/21/2024 Delivering provider: MAGALI BARKLEY CROME Date of discharge: 01/21/2024  Admitting diagnosis: Indication for care or intervention related to labor and delivery [O75.9] Intrauterine pregnancy: [redacted]w[redacted]d     Secondary diagnosis:  Principal Problem:   Indication for care or intervention related to labor and delivery  Additional problems: ***    Discharge diagnosis: {DX.:23714}                                              Post partum procedures:{Postpartum procedures:23558} Augmentation: AROM, Pitocin , and IP Foley Complications: None  Hospital course: Induction of Labor With Vaginal Delivery   24 y.o. yo G2P1001 at [redacted]w[redacted]d was admitted to the hospital 01/20/2024 for induction of labor.  Indication for induction: Cholestasis of pregnancy.  Patient had an labor course complicated by N/A Membrane Rupture Time/Date: 8:22 PM,01/20/2024  Delivery Method:VBAC, Spontaneous Operative Delivery:N/A Episiotomy: None Lacerations:    Details of delivery can be found in separate delivery note.  Patient had a postpartum course complicated by***. Patient is discharged home 01/21/2024.  Newborn Data: Birth date:01/21/2024 Birth time:2:31 AM Gender:Female Living status:Living Apgars:9 ,9  Weight:   Magnesium  Sulfate received: {Mag received:30440022} BMZ received: No Rhophylac:N/A MMR:N/A T-DaP:Declined prenatally, offered PP Flu: Yes RSV Vaccine received: No Transfusion:{Transfusion received:30440034}  Immunizations received: Immunization History  Administered Date(s) Administered   Influenza, Seasonal, Injecte, Preservative Fre 11/25/2023   Tdap 08/10/2021    Physical exam  Vitals:   01/21/24 0020 01/21/24 0030 01/21/24 0100 01/21/24 0200  BP:  111/72 124/67 121/68  Pulse:  74 85 99  Resp:      Temp:       TempSrc:      SpO2: 99% 99% 99%   Weight:      Height:       General: {Exam; general:21111117} Lochia: {Desc; appropriate/inappropriate:30686::appropriate} Uterine Fundus: {Desc; firm/soft:30687} Incision: {Exam; incision:21111123} DVT Evaluation: {Exam; dvt:2111122} Labs: Lab Results  Component Value Date   WBC 9.0 01/20/2024   HGB 10.4 (L) 01/20/2024   HCT 32.8 (L) 01/20/2024   MCV 81.2 01/20/2024   PLT 303 01/20/2024      Latest Ref Rng & Units 01/20/2024    3:25 AM  CMP  Glucose 70 - 99 mg/dL 80   BUN 6 - 20 mg/dL 10   Creatinine 9.55 - 1.00 mg/dL 9.58   Sodium 864 - 854 mmol/L 137   Potassium 3.5 - 5.1 mmol/L 3.7   Chloride 98 - 111 mmol/L 106   CO2 22 - 32 mmol/L 19   Calcium 8.9 - 10.3 mg/dL 8.5   Total Protein 6.5 - 8.1 g/dL 6.3   Total Bilirubin 0.0 - 1.2 mg/dL 0.3   Alkaline Phos 38 - 126 U/L 176   AST 15 - 41 U/L 20   ALT 0 - 44 U/L 14    Edinburgh Score:    11/27/2021   10:58 AM  Edinburgh Postnatal Depression Scale Screening Tool  I have been able to laugh and see the funny side of things. 0   I have looked forward with enjoyment to things. 0   I have blamed myself unnecessarily when things went wrong. 0   I have been anxious or worried for no good  reason. 0   I have felt scared or panicky for no good reason. 0   Things have been getting on top of me. 0   I have been so unhappy that I have had difficulty sleeping. 0   I have felt sad or miserable. 0   I have been so unhappy that I have been crying. 0   The thought of harming myself has occurred to me. 0   Edinburgh Postnatal Depression Scale Total 0      Data saved with a previous flowsheet row definition   No data recorded  After visit meds:  Allergies as of 01/21/2024   No Known Allergies   Med Rec must be completed prior to using this [facility]***        Discharge home in stable condition Infant Feeding: {Baby feeding:23562} Infant Disposition:{CHL IP OB HOME WITH  FNUYZM:76418} Discharge instruction: per After Visit Summary and Postpartum booklet. Activity: Advance as tolerated. Pelvic rest for 6 weeks.  Diet: {OB ipzu:78888878} Future Appointments: Future Appointments  Date Time Provider Department Center  01/24/2024 10:15 AM Zina Jerilynn LABOR, MD [facility] None   Follow up Visit:   Please schedule this patient for a In person postpartum visit in 6 weeks with the following provider: Any provider. Additional Postpartum F/U:2 hour GTT  High risk pregnancy complicated by: GDM Delivery mode:  VBAC, Spontaneous Anticipated Birth Control:  Unsure  Message sent to [facility] 1/13.    01/21/2024 Victorio Creeden LITTIE Angles, MD  .c  "

## 2024-01-22 ENCOUNTER — Encounter (HOSPITAL_COMMUNITY): Payer: Self-pay | Admitting: Obstetrics and Gynecology

## 2024-01-22 ENCOUNTER — Ambulatory Visit

## 2024-01-22 ENCOUNTER — Other Ambulatory Visit

## 2024-01-22 MED ORDER — IBUPROFEN 800 MG PO TABS: 800.0000 mg | ORAL_TABLET | Freq: Three times a day (TID) | ORAL | 0 refills | Status: AC

## 2024-01-22 NOTE — Patient Instructions (Signed)

## 2024-01-23 LAB — BIRTH TISSUE RECOVERY COLLECTION (PLACENTA DONATION)

## 2024-01-24 ENCOUNTER — Encounter: Payer: Self-pay | Admitting: Obstetrics and Gynecology

## 2024-01-31 ENCOUNTER — Telehealth (HOSPITAL_COMMUNITY): Payer: Self-pay | Admitting: *Deleted

## 2024-01-31 NOTE — Telephone Encounter (Signed)
 01/31/2024  Name: Danielle Lowery MRN: [id number] DOB: 03/12/2000  Reason for Call:  Transition of Care Hospital Discharge Call  Contact Status: Patient Contact Status: Complete  Language assistant needed: Interpreter Mode: Interpreter Not Needed        Follow-Up Questions: Do You Have Any Concerns About Your Health As You Heal From Delivery?: Yes What Concerns Do You Have About Your Health?: Patient notes pain from her laceration repairs. Pain is with walking and sitting. Denies fever, drainage, or odor from the area. She is using peribottle with each bathroom visit and she is using Dermoplast spray. Advised patient to try placing a small pillow under one hip when sitting to displace body weight from perineum to buttock to see if that reduces pain. Also encouraged use of sitz bath. Advised patient to call OB office if pain is not better within a few days. Do You Have Any Concerns About Your Infants Health?: No  Edinburgh Postnatal Depression Scale:  In the Past 7 Days:    PHQ2-9 Depression Scale:     Discharge Follow-up: Edinburgh score requires follow up?:  (Patient says answers are the same as in the hospital when score was 0. Patient endorses she is doing well emotionally.) Patient was advised of the following resources:: Support Group, Breastfeeding Support Group  Post-discharge interventions: Reviewed Newborn Safe Sleep Practices  Mliss Sieve, RN 01/31/2024 13:19

## 2024-03-03 ENCOUNTER — Other Ambulatory Visit: Payer: Self-pay

## 2024-03-03 ENCOUNTER — Ambulatory Visit: Payer: Self-pay | Admitting: Obstetrics and Gynecology
